# Patient Record
Sex: Female | Born: 1935 | Race: White | Hispanic: No | Smoking: Never smoker
Health system: Southern US, Community
[De-identification: ages and names within clinical notes are randomized; demographics above are authoritative.]

## PROBLEM LIST (undated history)

## (undated) DIAGNOSIS — C9000 Multiple myeloma not having achieved remission: Secondary | ICD-10-CM

## (undated) DIAGNOSIS — R630 Anorexia: Secondary | ICD-10-CM

## (undated) DIAGNOSIS — D72829 Elevated white blood cell count, unspecified: Secondary | ICD-10-CM

## (undated) DIAGNOSIS — R5383 Other fatigue: Secondary | ICD-10-CM

## (undated) DIAGNOSIS — K449 Diaphragmatic hernia without obstruction or gangrene: Secondary | ICD-10-CM

## (undated) DIAGNOSIS — H9190 Unspecified hearing loss, unspecified ear: Secondary | ICD-10-CM

## (undated) DIAGNOSIS — F329 Major depressive disorder, single episode, unspecified: Secondary | ICD-10-CM

## (undated) DIAGNOSIS — K219 Gastro-esophageal reflux disease without esophagitis: Secondary | ICD-10-CM

## (undated) DIAGNOSIS — H3530 Unspecified macular degeneration: Secondary | ICD-10-CM

## (undated) DIAGNOSIS — F0390 Unspecified dementia without behavioral disturbance: Secondary | ICD-10-CM

## (undated) DIAGNOSIS — S32010A Wedge compression fracture of first lumbar vertebra, initial encounter for closed fracture: Secondary | ICD-10-CM

## (undated) HISTORY — DX: Unspecified hearing loss, unspecified ear: H91.90

## (undated) HISTORY — DX: Other fatigue: R53.83

## (undated) HISTORY — DX: Anorexia: R63.0

## (undated) HISTORY — DX: Major depressive disorder, single episode, unspecified: F32.9

## (undated) HISTORY — DX: Multiple myeloma not having achieved remission: C90.00

## (undated) HISTORY — DX: Unspecified dementia, unspecified severity, without behavioral disturbance, psychotic disturbance, mood disturbance, and anxiety: F03.90

## (undated) HISTORY — DX: Elevated white blood cell count, unspecified: D72.829

## (undated) HISTORY — PX: NO PAST SURGERIES: SHX2092

## (undated) HISTORY — DX: Wedge compression fracture of first lumbar vertebra, initial encounter for closed fracture: S32.010A

---

## 1998-08-22 ENCOUNTER — Other Ambulatory Visit: Admission: RE | Admit: 1998-08-22 | Discharge: 1998-08-22 | Payer: Self-pay | Admitting: Gynecology

## 1998-09-04 ENCOUNTER — Ambulatory Visit (HOSPITAL_COMMUNITY): Admission: RE | Admit: 1998-09-04 | Discharge: 1998-09-04 | Payer: Self-pay | Admitting: Gastroenterology

## 2002-05-31 ENCOUNTER — Encounter: Payer: Self-pay | Admitting: Gastroenterology

## 2002-05-31 ENCOUNTER — Encounter: Admission: RE | Admit: 2002-05-31 | Discharge: 2002-05-31 | Payer: Self-pay | Admitting: Gastroenterology

## 2002-07-21 ENCOUNTER — Encounter: Admission: RE | Admit: 2002-07-21 | Discharge: 2002-07-21 | Payer: Self-pay | Admitting: Family Medicine

## 2002-07-21 ENCOUNTER — Encounter: Payer: Self-pay | Admitting: Family Medicine

## 2004-09-26 ENCOUNTER — Ambulatory Visit (HOSPITAL_COMMUNITY): Admission: RE | Admit: 2004-09-26 | Discharge: 2004-09-26 | Payer: Self-pay | Admitting: Gastroenterology

## 2011-06-23 DIAGNOSIS — S32010A Wedge compression fracture of first lumbar vertebra, initial encounter for closed fracture: Secondary | ICD-10-CM

## 2011-06-23 HISTORY — DX: Wedge compression fracture of first lumbar vertebra, initial encounter for closed fracture: S32.010A

## 2011-07-11 ENCOUNTER — Emergency Department (HOSPITAL_COMMUNITY): Payer: Medicare Other

## 2011-07-11 ENCOUNTER — Encounter (HOSPITAL_COMMUNITY): Payer: Self-pay | Admitting: *Deleted

## 2011-07-11 ENCOUNTER — Inpatient Hospital Stay (HOSPITAL_COMMUNITY)
Admission: EM | Admit: 2011-07-11 | Discharge: 2011-07-14 | DRG: 543 | Disposition: A | Discharge status: Skilled Nursing Facility | Payer: Medicare Other | Attending: Family Medicine | Admitting: Family Medicine

## 2011-07-11 ENCOUNTER — Other Ambulatory Visit: Payer: Self-pay

## 2011-07-11 DIAGNOSIS — R5381 Other malaise: Secondary | ICD-10-CM | POA: Diagnosis present

## 2011-07-11 DIAGNOSIS — E871 Hypo-osmolality and hyponatremia: Secondary | ICD-10-CM | POA: Diagnosis present

## 2011-07-11 DIAGNOSIS — E46 Unspecified protein-calorie malnutrition: Secondary | ICD-10-CM | POA: Diagnosis present

## 2011-07-11 DIAGNOSIS — M8448XA Pathological fracture, other site, initial encounter for fracture: Principal | ICD-10-CM | POA: Diagnosis present

## 2011-07-11 DIAGNOSIS — M810 Age-related osteoporosis without current pathological fracture: Secondary | ICD-10-CM | POA: Diagnosis present

## 2011-07-11 DIAGNOSIS — S32000A Wedge compression fracture of unspecified lumbar vertebra, initial encounter for closed fracture: Secondary | ICD-10-CM | POA: Diagnosis present

## 2011-07-11 DIAGNOSIS — H409 Unspecified glaucoma: Secondary | ICD-10-CM | POA: Diagnosis present

## 2011-07-11 DIAGNOSIS — Z23 Encounter for immunization: Secondary | ICD-10-CM

## 2011-07-11 DIAGNOSIS — R4189 Other symptoms and signs involving cognitive functions and awareness: Secondary | ICD-10-CM | POA: Diagnosis present

## 2011-07-11 DIAGNOSIS — R627 Adult failure to thrive: Secondary | ICD-10-CM | POA: Diagnosis present

## 2011-07-11 DIAGNOSIS — F0390 Unspecified dementia without behavioral disturbance: Secondary | ICD-10-CM | POA: Diagnosis present

## 2011-07-11 DIAGNOSIS — D72829 Elevated white blood cell count, unspecified: Secondary | ICD-10-CM | POA: Diagnosis present

## 2011-07-11 DIAGNOSIS — M5126 Other intervertebral disc displacement, lumbar region: Secondary | ICD-10-CM | POA: Diagnosis present

## 2011-07-11 DIAGNOSIS — Z79899 Other long term (current) drug therapy: Secondary | ICD-10-CM

## 2011-07-11 HISTORY — DX: Diaphragmatic hernia without obstruction or gangrene: K44.9

## 2011-07-11 LAB — URINALYSIS, ROUTINE W REFLEX MICROSCOPIC
Glucose, UA: NEGATIVE mg/dL
Hgb urine dipstick: NEGATIVE
Specific Gravity, Urine: 1.027 (ref 1.005–1.030)
pH: 5 (ref 5.0–8.0)

## 2011-07-11 LAB — COMPREHENSIVE METABOLIC PANEL
ALT: 9 U/L (ref 0–35)
AST: 16 U/L (ref 0–37)
CO2: 27 mEq/L (ref 19–32)
Chloride: 91 mEq/L — ABNORMAL LOW (ref 96–112)
GFR calc non Af Amer: 48 mL/min — ABNORMAL LOW (ref >90)
Glucose, Bld: 107 mg/dL — ABNORMAL HIGH (ref 70–99)
Sodium: 126 mEq/L — ABNORMAL LOW (ref 135–145)
Total Bilirubin: 0.3 mg/dL (ref 0.3–1.2)

## 2011-07-11 LAB — CBC
Hemoglobin: 11.9 g/dL — ABNORMAL LOW (ref 12.0–15.0)
MCV: 91.8 fL (ref 78.0–100.0)
Platelets: 365 10*3/uL (ref 150–400)
RBC: 3.9 MIL/uL (ref 3.87–5.11)
WBC: 12.1 10*3/uL — ABNORMAL HIGH (ref 4.0–10.5)

## 2011-07-11 LAB — URINE MICROSCOPIC-ADD ON

## 2011-07-11 MED ORDER — MORPHINE SULFATE 2 MG/ML IJ SOLN
0.5000 mg | INTRAMUSCULAR | Status: DC | PRN
Start: 2011-07-11 — End: 2011-07-14
  Administered 2011-07-12 (×2): 0.5 mg via INTRAVENOUS
  Filled 2011-07-11 (×2): qty 1

## 2011-07-11 MED ORDER — SODIUM CHLORIDE 0.9 % IV SOLN
INTRAVENOUS | Status: DC
  Administered 2011-07-12 – 2011-07-13 (×3): via INTRAVENOUS
  Administered 2011-07-13: 50 mL/h via INTRAVENOUS

## 2011-07-11 MED ORDER — HEPARIN SODIUM (PORCINE) 5000 UNIT/ML IJ SOLN
5000.0000 [IU] | Freq: Three times a day (TID) | INTRAMUSCULAR | Status: DC
  Administered 2011-07-12 – 2011-07-14 (×9): 5000 [IU] via SUBCUTANEOUS
  Filled 2011-07-11 (×11): qty 1

## 2011-07-11 MED ORDER — SODIUM CHLORIDE 0.9 % IV BOLUS (SEPSIS)
1000.0000 mL | Freq: Once | INTRAVENOUS | Status: AC
  Administered 2011-07-11: 1000 mL via INTRAVENOUS

## 2011-07-11 MED ORDER — ENSURE CLINICAL ST REVIGOR PO LIQD
237.0000 mL | Freq: Three times a day (TID) | ORAL | Status: DC
  Administered 2011-07-12 (×3): 237 mL via ORAL

## 2011-07-11 MED ORDER — ACETAMINOPHEN 325 MG PO TABS
650.0000 mg | ORAL_TABLET | Freq: Four times a day (QID) | ORAL | Status: DC | PRN
  Administered 2011-07-12: 650 mg via ORAL
  Filled 2011-07-11: qty 2

## 2011-07-11 MED ORDER — DOCUSATE SODIUM 100 MG PO CAPS
100.0000 mg | ORAL_CAPSULE | Freq: Two times a day (BID) | ORAL | Status: DC
  Administered 2011-07-12 – 2011-07-14 (×6): 100 mg via ORAL
  Filled 2011-07-11 (×7): qty 1

## 2011-07-11 MED ORDER — ACETAMINOPHEN 650 MG RE SUPP: 650.0000 mg | Freq: Four times a day (QID) | RECTAL | Status: DC | PRN

## 2011-07-11 MED ORDER — HEPARIN SODIUM (PORCINE) 5000 UNIT/ML IJ SOLN: 5000.0000 [IU] | Freq: Three times a day (TID) | INTRAMUSCULAR | Status: DC

## 2011-07-11 NOTE — ED Notes (Signed)
The patient used a  Engineer, manufacturing systems.

## 2011-07-11 NOTE — ED Provider Notes (Signed)
  I performed a history and physical examination of Jamie Bass and discussed her management with Dr. Meredith Pel.  I agree with the history, physical, assessment, and plan of care, with the following exceptions: None  Generalized weakness and low back pain. Overall she states she feels poorly. No paresthesias. No loss of sensation. No altered mental status. She denies headache. Denies chest pain, shortness of breath.  Lumbosacral tenderness on palpation. Nonfocal neurologic examination. Regular rate and rhythm. Lungs clear to auscultation  I was present for the following procedures: None Time Spent in Critical Care of the patient: None  Labs, imaging, ivf, sx control.  After review of the labs will require admission for hyponatremia  Tildon Husky, MD 07/11/11 2345

## 2011-07-11 NOTE — ED Notes (Signed)
I gave the patient a warm blanket. 

## 2011-07-11 NOTE — ED Notes (Signed)
Meal tray provided per pt request.  

## 2011-07-11 NOTE — ED Notes (Signed)
Family at bedside. 

## 2011-07-11 NOTE — ED Notes (Signed)
Son requesting to be called by admission MD. [phone] or [phone]. Requesting they be called tonight or tomorrow afternoon.

## 2011-07-11 NOTE — ED Provider Notes (Signed)
History     CSN: [id number]  Arrival date & time 07/11/11  1550   First MD Initiated Contact with Patient 07/11/11 1724      Chief Complaint  Patient presents with  . Weakness    (Consider location/radiation/quality/duration/timing/severity/associated sxs/prior treatment) HPI Comments: 78yo CF with no significant medical comorbidity who presents to the ED with her family primarily due to generalized weakness and low back pain. Onset about 3-4 days ago. Has had some mild cognitive impairment over the last several months, but still lives at home independently and cares for her oldest son who is impaired from bipolar and schizophrenia.   Patient is a 78 y.o. female presenting with weakness. The history is provided by the patient.  Weakness The primary symptoms include memory loss (mild over last several months). Primary symptoms do not include headaches, syncope, loss of consciousness, altered mental status, seizures, dizziness, visual change, paresthesias, focal weakness, loss of sensation, speech change, fever, nausea or vomiting. Episode onset: several months ago. The symptoms are unchanged. The neurological symptoms are diffuse. Context: no recent trauma.  Additional symptoms include weakness (generalized) and lower back pain (mild ). Additional symptoms do not include neck stiffness, leg pain, loss of balance, photophobia, taste disturbance, hearing loss, vertigo, anxiety or irritability. Medical issues do not include seizures, cerebral vascular accident, diabetes or hypertension.    History reviewed. No pertinent past medical history.  History reviewed. No pertinent past surgical history.  History reviewed. No pertinent family history.  History  Substance Use Topics  . Smoking status: Never Smoker   . Smokeless tobacco: Not on file  . Alcohol Use: No    OB History    Grav Para Term Preterm Abortions TAB SAB Ect Mult Living                  Review of Systems    Constitutional: Positive for fatigue. Negative for fever, chills, activity change, appetite change and irritability.  HENT: Negative for hearing loss, congestion, sore throat, rhinorrhea, neck pain and neck stiffness.   Eyes: Negative for photophobia, redness and visual disturbance.  Respiratory: Negative for cough, shortness of breath and wheezing.   Cardiovascular: Negative for chest pain, palpitations, leg swelling and syncope.  Gastrointestinal: Negative for nausea, vomiting, abdominal pain, diarrhea, constipation and blood in stool.  Genitourinary: Negative for dysuria, urgency, hematuria and flank pain.  Musculoskeletal: Positive for back pain (low back).  Skin: Negative for rash and wound.  Neurological: Positive for weakness (generalized). Negative for dizziness, vertigo, speech change, focal weakness, seizures, loss of consciousness, facial asymmetry, speech difficulty, light-headedness, numbness, headaches, paresthesias and loss of balance.  Psychiatric/Behavioral: Positive for memory loss (mild over last several months). Negative for confusion and altered mental status.  All other systems reviewed and are negative.    Allergies  Review of patient's allergies indicates no known allergies.  Home Medications   Current Outpatient Rx  Name Route Sig Dispense Refill  . ACETAMINOPHEN-ASPIRIN BUFFERED 250-250 MG PO TABS Oral Take 1 tablet by mouth every 4 (four) hours as needed. For back pain    . DOXYCYCLINE HYCLATE 100 MG PO TABS Oral Take 100 mg by mouth every 12 (twelve) hours. For 10 days; Start date 07/03/11      BP 109/43  Pulse 80  Temp(Src) 97.4 F (36.3 C) (Oral)  Resp 18  SpO2 96%  Physical Exam  Nursing note and vitals reviewed. Constitutional: She is oriented to person, place, and time. She appears well-developed and well-nourished.  Non-toxic appearance. No distress.  HENT:  Head: Normocephalic and atraumatic.  Mouth/Throat: Oropharynx is clear and moist.   Eyes: Conjunctivae and EOM are normal. Pupils are equal, round, and reactive to light. No scleral icterus.  Neck: Normal range of motion. Neck supple. No JVD present.  Cardiovascular: Normal rate, regular rhythm, normal heart sounds and intact distal pulses.   No murmur heard. Pulmonary/Chest: Effort normal and breath sounds normal. No respiratory distress. She has no wheezes. She has no rales.  Abdominal: Soft. Bowel sounds are normal. She exhibits no distension and no mass. There is no tenderness. There is no rebound, no guarding and no CVA tenderness.  Musculoskeletal: Normal range of motion.       Cervical back: Normal.       Thoracic back: Normal.       Lumbar back: She exhibits tenderness (soft tissues of lumbar region. ). She exhibits no bony tenderness, no swelling, no edema, no deformity and no laceration.  Neurological: She is alert and oriented to person, place, and time. She has normal strength. No cranial nerve deficit. GCS eye subscore is 4. GCS verbal subscore is 5. GCS motor subscore is 6.  Skin: Skin is warm and dry. No rash noted. She is not diaphoretic.  Psychiatric: She has a normal mood and affect.    ED Course  Procedures (including critical care time)  Labs Reviewed  CBC - Abnormal; Notable for the following:    WBC 12.1 (*)    Hemoglobin 11.9 (*)    HCT 35.8 (*)    All other components within normal limits  COMPREHENSIVE METABOLIC PANEL - Abnormal; Notable for the following:    Sodium 126 (*)    Chloride 91 (*)    Glucose, Bld 107 (*)    BUN 26 (*)    Albumin 2.6 (*)    GFR calc non Af Amer 48 (*)    GFR calc Af Amer 56 (*)    All other components within normal limits  URINALYSIS, ROUTINE W REFLEX MICROSCOPIC - Abnormal; Notable for the following:    Color, Urine AMBER (*) BIOCHEMICALS MAY BE AFFECTED BY COLOR   APPearance CLOUDY (*)    Bilirubin Urine SMALL (*)    Ketones, ur 15 (*)    Protein, ur 30 (*)    Leukocytes, UA SMALL (*)    All other  components within normal limits  URINE MICROSCOPIC-ADD ON - Abnormal; Notable for the following:    Squamous Epithelial / LPF FEW (*)    All other components within normal limits  LIPASE, BLOOD  LACTIC ACID, PLASMA  TROPONIN I  TSH   Dg Chest 2 View  07/11/2011  *RADIOLOGY REPORT*  Clinical Data: Low back pain.  No known trauma.  CHEST - 2 VIEW  Comparison: None.  Findings: Normal heart size with calcified tortuous aorta.  Mild scarring or subsegmental atelectasis, left base laterally but no infiltrates or failure.  No effusion or pneumothorax.  No visible thoracic spine compression fractures. Please see lumbar spine dictation for additional findings.  IMPRESSION: Chronic changes.  No active infiltrates.  No visible thoracic compression fracture. L1 compression fracture suspected.  Original Report Authenticated By: Elsie Stain, M.D.   Dg Lumbar Spine Complete  07/11/2011  *RADIOLOGY REPORT*  Clinical Data: Low back pain  LUMBAR SPINE - COMPLETE 4+ VIEW  Comparison: None.  Findings: Five lumbar type vertebral bodies are present.  The bones appear demineralized.  There is a wedge deformity of L1 with  loss of approximately 50% vertebral body height, age indeterminate. Disc space narrowing L2-L3 and L1-L2.  Mild 3 mm facet mediated slip L4 on L5 is observed.  Vascular calcification is seen.  IMPRESSION: Age indeterminate L1 compression fracture.  3 mm facet mediated slip L4-L5.  Per CMS PQRS reporting requirements (PQRS Measure 24): Given the patient's age of greater than 50 and the fracture site (hip, distal radius, or spine), the patient should be tested for osteoporosis using DXA, and the appropriate treatment considered based on the DXA results.  Original Report Authenticated By: Elsie Stain, M.D.     1. Hyponatremia   2. Cognitive decline   3. Lumbar compression fracture       MDM  78yo CF with no significant medical comorbidity who presents to the ED with her family primarily due to  generalized weakness and low back pain. Onset about 3-4 days ago. Has had some mild cognitive impairment over the last several months, but still lives at home independently and cares for her oldest son who is impaired from bipolar and schizophrenia.   Pt appears well on exam. Just states that she has mild weakness and fatigue but still wakes up early every morning and does exercises. Pt with muscle tenderness in lower back. No abdominal pain or palpable masses. She does not recall any trauma and no evidence of trauma on exam. Will send basic labs and eval for anemia or UTI. Considered AAA but no abd pain and palpable low back tenderness.   Pt hyponatremic with L1 compression fx.   Family med consulted for admission.         Verne Carrow, MD 07/11/11 [phone]

## 2011-07-11 NOTE — ED Notes (Signed)
The pt is c/o  Some lower back pain and weakness today.  Jittery and anxious apperaing

## 2011-07-12 ENCOUNTER — Inpatient Hospital Stay (HOSPITAL_COMMUNITY): Payer: Medicare Other

## 2011-07-12 ENCOUNTER — Encounter (HOSPITAL_COMMUNITY): Payer: Self-pay | Admitting: *Deleted

## 2011-07-12 DIAGNOSIS — IMO0002 Reserved for concepts with insufficient information to code with codable children: Secondary | ICD-10-CM

## 2011-07-12 DIAGNOSIS — E871 Hypo-osmolality and hyponatremia: Secondary | ICD-10-CM

## 2011-07-12 DIAGNOSIS — F0390 Unspecified dementia without behavioral disturbance: Secondary | ICD-10-CM

## 2011-07-12 LAB — BASIC METABOLIC PANEL
Chloride: 98 mEq/L (ref 96–112)
Creatinine, Ser: 0.87 mg/dL (ref 0.50–1.10)
GFR calc Af Amer: 64 mL/min — ABNORMAL LOW (ref >90)
Potassium: 3.8 mEq/L (ref 3.5–5.1)
Sodium: 130 mEq/L — ABNORMAL LOW (ref 135–145)

## 2011-07-12 LAB — VITAMIN B12: Vitamin B-12: 475 pg/mL (ref 211–911)

## 2011-07-12 LAB — CBC
HCT: 34.4 % — ABNORMAL LOW (ref 36.0–46.0)
RDW: 13.4 % (ref 11.5–15.5)
WBC: 7.9 10*3/uL (ref 4.0–10.5)

## 2011-07-12 MED ORDER — ACETAMINOPHEN 650 MG RE SUPP
650.0000 mg | Freq: Four times a day (QID) | RECTAL | Status: DC
  Filled 2011-07-12 (×8): qty 1

## 2011-07-12 MED ORDER — CALCIUM CARBONATE-VITAMIN D 500-200 MG-UNIT PO TABS
1.0000 | ORAL_TABLET | Freq: Three times a day (TID) | ORAL | Status: DC
  Administered 2011-07-12 – 2011-07-14 (×8): 1 via ORAL
  Filled 2011-07-12 (×9): qty 1

## 2011-07-12 MED ORDER — INFLUENZA VIRUS VACC SPLIT PF IM SUSP
0.5000 mL | INTRAMUSCULAR | Status: AC
  Filled 2011-07-12: qty 0.5

## 2011-07-12 MED ORDER — CALCITONIN (SALMON) 200 UNIT/ACT NA SOLN
1.0000 | Freq: Every day | NASAL | Status: DC
  Administered 2011-07-12 – 2011-07-14 (×2): 1 via NASAL
  Filled 2011-07-12: qty 3.7

## 2011-07-12 MED ORDER — ACETAMINOPHEN 325 MG PO TABS
650.0000 mg | ORAL_TABLET | Freq: Four times a day (QID) | ORAL | Status: DC
  Administered 2011-07-12 – 2011-07-14 (×10): 650 mg via ORAL
  Filled 2011-07-12 (×5): qty 2
  Filled 2011-07-12: qty 1
  Filled 2011-07-12 (×5): qty 2

## 2011-07-12 MED ORDER — BIOTENE DRY MOUTH MT LIQD
15.0000 mL | Freq: Two times a day (BID) | OROMUCOSAL | Status: DC
  Administered 2011-07-12 – 2011-07-14 (×4): 15 mL via OROMUCOSAL

## 2011-07-12 NOTE — Progress Notes (Signed)
PGY-1 Daily Progress Note Family Medicine Teaching Service Aldine Contes. Marti Sleigh, MD Service Pager: [phone]   Subjective: Patient reports 0/10 pain at rest this morning after receiving Tylenol last night. Minimal pain with sitting up. Patient states she feels much better. She is sad she isn't getting to go to church today.   Objective:  Temp:  [97.4 F (36.3 C)-97.8 F (36.6 C)] 97.6 F (36.4 C) (01/20 0500) Pulse Rate:  [73-85] 73  (01/20 0500) Resp:  [16-18] 18  (01/20 0500) BP: (109-153)/(43-71) 153/71 mmHg (01/20 0500) SpO2:  [96 %-99 %] 99 % (01/20 0500) Weight:  [123 lb 14.4 oz (56.2 kg)] 123 lb 14.4 oz (56.2 kg) (01/19 2309)  Intake/Output Summary (Last 24 hours) at 07/12/11 0840 Last data filed at 07/12/11 0700  Gross per 24 hour  Intake  962.5 ml  Output    400 ml  Net  562.5 ml    Gen:  NAD, thin elderly female HEENT: moist mucous membranes when previously dry. Multiple cavities CV: Regular rate and rhythm, no murmurs rubs or gallops PULM: minimal crackles in RLL. No wheezes. Breathing not labored ABD: soft/nontender/nondistended/normal bowel sounds EXT: Trace edema Neuro: Alert to person and place. Timing-only knows the month.  Gait-not unsteady, patient also gets in and out of bed without difficulty  Labs and imaging:   CBC  Lab 07/12/11 0500 07/11/11 1801  WBC 7.9 12.1*  HGB 11.3* 11.9*  HCT 34.4* 35.8*  PLT 342 365   BMET  Lab 07/12/11 0500 07/11/11 1801  NA 130* 126*  K 3.8 3.9  CL 98 91*  CO2 26 27  BUN 20 26*  CREATININE 0.87 0.97  LABGLOM -- --  GLUCOSE 100* --  CALCIUM 8.7 8.9   LIPASE, BLOOD     Status: Normal   Collection Time   07/11/11  6:01 PM      Component Value Range   Lipase 44  11 - 59 (U/L)  TSH     Status: Normal   Collection Time   07/11/11  6:01 PM      Component Value Range   TSH 4.108  0.350 - 4.500 (uIU/mL)  LACTIC ACID, PLASMA     Status: Normal   Collection Time   07/11/11  6:02 PM      Component Value Range     Lactic Acid, Venous 1.3  0.5 - 2.2 (mmol/L)  TROPONIN I     Status: Normal   Collection Time   07/11/11  6:06 PM      Component Value Range   Troponin I <0.30  <0.30 (ng/mL)  URINALYSIS, ROUTINE W REFLEX MICROSCOPIC     Status: Abnormal   Collection Time   07/11/11  6:20 PM      Component Value Range   Color, Urine AMBER (*) YELLOW    APPearance CLOUDY (*) CLEAR    Specific Gravity, Urine 1.027  1.005 - 1.030    pH 5.0  5.0 - 8.0    Glucose, UA NEGATIVE  NEGATIVE (mg/dL)   Hgb urine dipstick NEGATIVE  NEGATIVE    Bilirubin Urine SMALL (*) NEGATIVE    Ketones, ur 15 (*) NEGATIVE (mg/dL)   Protein, ur 30 (*) NEGATIVE (mg/dL)   Urobilinogen, UA 1.0  0.0 - 1.0 (mg/dL)   Nitrite NEGATIVE  NEGATIVE    Leukocytes, UA SMALL (*) NEGATIVE   URINE MICROSCOPIC-ADD ON     Status: Abnormal   Collection Time   07/11/11  6:20 PM  Component Value Range   Squamous Epithelial / LPF FEW (*) RARE    WBC, UA 3-6  <3 (WBC/hpf)   Dg Chest 2 View  07/11/2011  *RADIOLOGY REPORT*  Clinical Data: Low back pain.  No known trauma.  CHEST - 2 VIEW  Comparison: None.  Findings: Normal heart size with calcified tortuous aorta.  Mild scarring or subsegmental atelectasis, left base laterally but no infiltrates or failure.  No effusion or pneumothorax.  No visible thoracic spine compression fractures. Please see lumbar spine dictation for additional findings.  IMPRESSION: Chronic changes.  No active infiltrates.  No visible thoracic compression fracture. L1 compression fracture suspected.  Original Report Authenticated By: Elsie Stain, M.D.   Dg Lumbar Spine Complete  07/11/2011  *RADIOLOGY REPORT*  Clinical Data: Low back pain  LUMBAR SPINE - COMPLETE 4+ VIEW  Comparison: None.  Findings: Five lumbar type vertebral bodies are present.  The bones appear demineralized.  There is a wedge deformity of L1 with loss of approximately 50% vertebral body height, age indeterminate. Disc space narrowing L2-L3 and L1-L2.   Mild 3 mm facet mediated slip L4 on L5 is observed.  Vascular calcification is seen.  IMPRESSION: Age indeterminate L1 compression fracture.  3 mm facet mediated slip L4-L5.  Per CMS PQRS reporting requirements (PQRS Measure 24): Given the patient's age of greater than 50 and the fracture site (hip, distal radius, or spine), the patient should be tested for osteoporosis using DXA, and the appropriate treatment considered based on the DXA results.  Original Report Authenticated By: Elsie Stain, M.D.     Assessment  78 y.o. year old female without an established PCP with generalized weakness, progressive decline in memory and function over last 6 months,  and low back pain found to be dehydrated and hyponatremic as well as to have an L1 compression fracture and L4-L5 facet mediated slipped disc.   1. Weakness/deconditioning-likely multifactorial including aging, hyponatremia, dehydration, deconditioning, recent URI, back pain preventing movement. Patient reports doing 2 pushups within the last few days. Unsure how much of decline is weakness vs. Memory. Patient with good movement and strength this morning.  *PT/OT to evaluate patient-if no SNF recommendation, possibly could d/c home after workup per #3.  *consult to SW for possible placment given primary caregiver with limitations. Family seems primarily interested in bringing patient home but will f/u recs PT/OT.  2. Hyponatremia- Na 126 in ED. believed to be hypovolemic hyponatremia. Patient also with tea and toast diet likely contributing.  S/p 1L bolus in ED. Hydrated with NS at 75cc per hour overnight with Sodium improved to 130 this AM. Will continue NS and recheck AM BMET if still here.   3. Low back pain-L1 compression fracture and L4-L5 facet mediated slipped disc  *tylenol prn for pain has controlled pain. Will switch to scheduled tylenol.  *also starting calicitonin  *also has prn morphine ordered at 0.5mg  q4 hours. Patient has not  required to this point.  *bisphosphonate at discharge *calcium and vitamin D supplementation. Pending Vitamin D level.   4. Progressive decline in mental status with concern for dementia-MMSE 21-no acute change in mental status per family. Family with concerns for dementia and have PCP appt on Friday. Will start dementia workup *CT Head pending *f/u TSH, B1, folic acid, RPR  4. Leukocytosis-now resolved with hydration. UA with small leukocytes but asymptomatic. CXR without acute process. No signs of skin breakdown. Will monitor fever curve and repeat CBC in AM. Will not continue  doxycycline as prescribed by urgent care for URI symptoms   5. Dehydration/malnutrition-started on ensure shakes and consulted nutrition. Prealbumin pending. Fluid repletion per #1.   FEN/GI-NS at 75/hr. REgular diet with ensure shakes.  PPx-heparin sq  Dispo-pending further evaluation  Tana Conch, MD PGY1, Family Medicine Teaching Service [phone]

## 2011-07-12 NOTE — Progress Notes (Signed)
Physical Therapy Evaluation Patient Details Name: Jamie Bass MRN: [id number] DOB: Jun 01, 1915 Today's Date: 07/12/2011  Problem List: There is no problem list on file for this patient.   Past Medical History:  Past Medical History  Diagnosis Date  . Hiatal hernia   . Glaucoma    Past Surgical History:  Past Surgical History  Procedure Date  . No past surgeries     PT Assessment/Plan/Recommendation PT Assessment Clinical Impression Statement: 78 yo female admitted with lumbar compression fx, hyponatremia, and cognitive decline presents to PT with decr functional mobility and impairmetns listed below; will benefit from acute PT to maximize independence and safety with mobility to enable safe dc home with son PT Recommendation/Assessment: Patient will need skilled PT in the acute care venue PT Problem List: Decreased range of motion;Decreased activity tolerance;Decreased balance;Decreased mobility;Decreased cognition;Decreased knowledge of use of DME;Decreased safety awareness;Decreased knowledge of precautions;Pain PT Therapy Diagnosis : Difficulty walking;Acute pain PT Plan PT Frequency: Min 5X/week PT Treatment/Interventions: DME instruction;Gait training;Stair training;Functional mobility training;Therapeutic activities;Therapeutic exercise;Balance training;Patient/family education;Cognitive remediation PT Recommendation Recommendations for Other Services: OT consult Follow Up Recommendations: Home health PT;Supervision/Assistance - 24 hour Equipment Recommended: Rolling walker with 5" wheels;3 in 1 bedside comode PT Goals  Acute Rehab PT Goals PT Goal Formulation: With patient Time For Goal Achievement: 2 weeks Pt will go Supine/Side to Sit: with modified independence (log roll technique for comfort) PT Goal: Supine/Side to Sit - Progress: Goal set today Pt will go Sit to Supine/Side: with modified independence PT Goal: Sit to Supine/Side - Progress: Goal set today Pt  will go Sit to Stand: with supervision PT Goal: Sit to Stand - Progress: Goal set today Pt will go Stand to Sit: with supervision PT Goal: Stand to Sit - Progress: Goal set today Pt will Ambulate: >150 feet;with supervision;with rolling walker PT Goal: Ambulate - Progress: Goal set today Pt will Go Up / Down Stairs: 3-5 stairs;with min assist;with rail(s) PT Goal: Up/Down Stairs - Progress: Goal set today  PT Evaluation Precautions/Restrictions  Precautions Precautions: Fall Prior Functioning  Home Living Lives With: Sheran Spine Help From: Family Type of Home: House Home Layout: One level Home Access: Stairs to enter Entrance Stairs-Rails: Right;Left (not exac) Entrance Stairs-Number of Steps: 4 Prior Function Level of Independence: Independent with basic ADLs (pt reports she performs pushups daily) Comments: Not sure of relaibility of pt re: PLOF Cognition Cognition Arousal/Alertness: Awake/alert Overall Cognitive Status: Impaired Safety/Judgement: Decreased awareness of safety precautions;Decreased safety judgement for tasks assessed Decreased Safety/Judgement: Decreased awareness of need for assistance Awareness of Deficits: Decreased awareness of deficits Cognition - Other Comments: general decline in cognitive status Sensation/Coordination Sensation Light Touch: Appears Intact Coordination Gross Motor Movements are Fluid and Coordinated: Yes Fine Motor Movements are Fluid and Coordinated: Not tested Extremity Assessment RUE Assessment RUE Assessment: Within Functional Limits LUE Assessment LUE Assessment: Within Functional Limits RLE Assessment RLE Assessment: Exceptions to [facility] RLE Strength RLE Overall Strength Comments: grossly limited by back pain LLE Assessment LLE Assessment: Exceptions to [facility] LLE Strength LLE Overall Strength Comments: grossly limited by back pain Mobility (including Balance) Bed Mobility Bed Mobility: Yes Supine to Sit: 4: Min  assist Supine to Sit Details (indicate cue type and reason): cues for safe transfer Transfers Transfers: Yes Sit to Stand: 4: Min assist Sit to Stand Details (indicate cue type and reason): safety cues Stand to Sit: 4: Min assist;To chair/3-in-1;With upper extremity assist Stand to Sit Details: cues to control descent Ambulation/Gait Ambulation/Gait: Yes Ambulation/Gait Assistance:  4: Min assist Ambulation/Gait Assistance Details (indicate cue type and reason): noted pt initially reaching out for UE support, indicative of fall risk; offered RW, which pt used overall well with cues for posture and RW proximity, cues also to push down into RW to take pressure off of painful low back Ambulation Distance (Feet): 80 Feet Assistive device: Rolling walker Gait Pattern: Antalgic       End of Session PT - End of Session Activity Tolerance: Patient tolerated treatment well;Patient limited by pain Patient left: in chair;with call bell in reach General Behavior During Session: [facility] for tasks performed Cognition: Impaired, at baseline  Van Clines [facility] [place], [place] [phone]  07/12/2011, 5:31 PM

## 2011-07-12 NOTE — H&P (Signed)
Family Medicine Teaching [facility] Admission History and Physical  Patient name: Jamie Bass Medical record number: [id number] Date of birth: Apr 26, 1915 Age: 78 y.o. Gender: female  Primary Care Provider: Kaleen Mask, MD, MD reportedly of [facility]  Chief Complaint: generalized weakness and low back pain History of Present Illness: Jamie Bass is a 78 y.o. year old female without an established PCP, history or occasional UTIs through urgent care presenting with generalized weakness and low back pain.   Over the past 2-3 days, patient has reported increased generalized weakness in addition to low back pain. She has spent several hours a day moaning due to back pain.  Pain located lateral to spine and rated 8/10 with movement. No reported history of falls, fevers, chills, trauma, bowel or bladder incontinence, saddle anesthesia.   She has had decreased PO during these last few days but overall in the last 2 weeks she has been eating less after getting some URI symptoms (treated with doxycycline per family report). For example, today only had 2 pieces of toast and an ensure all day. Family states patient has had a somewhat progressive decline in memory and general activity level over the last 6 months. Previously, she did the majority of cooking, cleaning , and yardwork for her and her son who is bipolar and schizophrenic. This son has been providing more and more help over the last 6 months.   They were planning to see Dr. Jeannetta Nap on Friday of this week to discuss these things as well as low back pain but did not get in due to the snow. They are planning on seeing him next Friday.   In the ED, the patient was found to have an L1 compression fracture, signs of dehydration, as well as to be hyponatremic into the 120s. Family medicine was consulted for admission.     Past Medical History  Diagnosis Date  . Hiatal hernia   . Glaucoma   History of occasional  UTI  History reviewed. No pertinent past surgical history.  History reviewed. No pertinent family history. Social History:  reports that she has never smoked. She does not have any smokeless tobacco history on file. She reports that she does not drink alcohol. Her drug history not on file. Lives with bipolar son Jamie Bass.  Best contact is son Jamie Bass either at [phone] or [phone].   Allergies: No Known Allergies  Medications Prior to Admission  Medication Dose Route Frequency Provider Last Rate Last Dose  . 0.9 %  sodium chloride infusion   Intravenous Continuous Tana Conch, MD      . acetaminophen (TYLENOL) tablet 650 mg  650 mg Oral Q6H PRN Tana Conch, MD       Or  . acetaminophen (TYLENOL) suppository 650 mg  650 mg Rectal Q6H PRN Tana Conch, MD      . docusate sodium (COLACE) capsule 100 mg  100 mg Oral BID Tana Conch, MD      . feeding supplement (ENSURE CLINICAL STRENGTH) liquid 237 mL  237 mL Oral TID WC Tana Conch, MD      . heparin injection 5,000 Units  5,000 Units Subcutaneous Q8H Tana Conch, MD      . morphine 2 MG/ML injection 0.5 mg  0.5 mg Intravenous Q4H PRN Tana Conch, MD      . sodium chloride 0.9 % bolus 1,000 mL  1,000 mL Intravenous Once Verne Carrow, MD   1,000 mL at  07/11/11 1808  . DISCONTD: heparin injection 5,000 Units  5,000 Units Subcutaneous Q8H Ivy de Lawson Radar, MD       No current outpatient prescriptions on file as of 07/12/2011.    Results for orders placed during the hospital encounter of 07/11/11 (from the past 48 hour(s))  CBC     Status: Abnormal   Collection Time   07/11/11  6:01 PM      Component Value Range Comment   WBC 12.1 (*) 4.0 - 10.5 (K/uL)    RBC 3.90  3.87 - 5.11 (MIL/uL)    Hemoglobin 11.9 (*) 12.0 - 15.0 (g/dL)    HCT 16.1 (*) 09.6 - 46.0 (%)    MCV 91.8  78.0 - 100.0 (fL)    MCH 30.5  26.0 - 34.0 (pg)    MCHC 33.2  30.0 - 36.0 (g/dL)    RDW 04.5  40.9 - 81.1 (%)    Platelets 365  150 - 400  (K/uL)   COMPREHENSIVE METABOLIC PANEL     Status: Abnormal   Collection Time   07/11/11  6:01 PM      Component Value Range Comment   Sodium 126 (*) 135 - 145 (mEq/L)    Potassium 3.9  3.5 - 5.1 (mEq/L)    Chloride 91 (*) 96 - 112 (mEq/L)    CO2 27  19 - 32 (mEq/L)    Glucose, Bld 107 (*) 70 - 99 (mg/dL)    BUN 26 (*) 6 - 23 (mg/dL)    Creatinine, Ser 9.14  0.50 - 1.10 (mg/dL)    Calcium 8.9  8.4 - 10.5 (mg/dL)    Total Protein 7.3  6.0 - 8.3 (g/dL)    Albumin 2.6 (*) 3.5 - 5.2 (g/dL)    AST 16  0 - 37 (U/L)    ALT 9  0 - 35 (U/L)    Alkaline Phosphatase 117  39 - 117 (U/L)    Total Bilirubin 0.3  0.3 - 1.2 (mg/dL)    GFR calc non Af Amer 48 (*) >90 (mL/min)    GFR calc Af Amer 56 (*) >90 (mL/min)   LIPASE, BLOOD     Status: Normal   Collection Time   07/11/11  6:01 PM      Component Value Range Comment   Lipase 44  11 - 59 (U/L)   LACTIC ACID, PLASMA     Status: Normal   Collection Time   07/11/11  6:02 PM      Component Value Range Comment   Lactic Acid, Venous 1.3  0.5 - 2.2 (mmol/L)   TROPONIN I     Status: Normal   Collection Time   07/11/11  6:06 PM      Component Value Range Comment   Troponin I <0.30  <0.30 (ng/mL)   URINALYSIS, ROUTINE W REFLEX MICROSCOPIC     Status: Abnormal   Collection Time   07/11/11  6:20 PM      Component Value Range Comment   Color, Urine AMBER (*) YELLOW  BIOCHEMICALS MAY BE AFFECTED BY COLOR   APPearance CLOUDY (*) CLEAR     Specific Gravity, Urine 1.027  1.005 - 1.030     pH 5.0  5.0 - 8.0     Glucose, UA NEGATIVE  NEGATIVE (mg/dL)    Hgb urine dipstick NEGATIVE  NEGATIVE     Bilirubin Urine SMALL (*) NEGATIVE     Ketones, ur 15 (*) NEGATIVE (mg/dL)    Protein, ur 30 (*)  NEGATIVE (mg/dL)    Urobilinogen, UA 1.0  0.0 - 1.0 (mg/dL)    Nitrite NEGATIVE  NEGATIVE     Leukocytes, UA SMALL (*) NEGATIVE    URINE MICROSCOPIC-ADD ON     Status: Abnormal   Collection Time   07/11/11  6:20 PM      Component Value Range Comment   Squamous  Epithelial / LPF FEW (*) RARE     WBC, UA 3-6  <3 (WBC/hpf)    Dg Chest 2 View  07/11/2011  *RADIOLOGY REPORT*  Clinical Data: Low back pain.  No known trauma.  CHEST - 2 VIEW  Comparison: None.  Findings: Normal heart size with calcified tortuous aorta.  Mild scarring or subsegmental atelectasis, left base laterally but no infiltrates or failure.  No effusion or pneumothorax.  No visible thoracic spine compression fractures. Please see lumbar spine dictation for additional findings.  IMPRESSION: Chronic changes.  No active infiltrates.  No visible thoracic compression fracture. L1 compression fracture suspected.  Original Report Authenticated By: Elsie Stain, M.D.   Dg Lumbar Spine Complete  07/11/2011  *RADIOLOGY REPORT*  Clinical Data: Low back pain  LUMBAR SPINE - COMPLETE 4+ VIEW  Comparison: None.  Findings: Five lumbar type vertebral bodies are present.  The bones appear demineralized.  There is a wedge deformity of L1 with loss of approximately 50% vertebral body height, age indeterminate. Disc space narrowing L2-L3 and L1-L2.  Mild 3 mm facet mediated slip L4 on L5 is observed.  Vascular calcification is seen.  IMPRESSION: Age indeterminate L1 compression fracture.  3 mm facet mediated slip L4-L5.  Per CMS PQRS reporting requirements (PQRS Measure 24): Given the patient's age of greater than 50 and the fracture site (hip, distal radius, or spine), the patient should be tested for osteoporosis using DXA, and the appropriate treatment considered based on the DXA results.  Original Report Authenticated By: Elsie Stain, M.D.    ROS. No dysuria or polyuria. negative except as noted in HPI    Blood pressure 136/69, pulse 82, temperature 97.8 F (36.6 C), temperature source Oral, resp. rate 16, height 5\' 4"  (1.626 m), weight 123 lb 14.4 oz (56.2 kg), SpO2 98.00%. Physical Exam  Vitals reviewed. Constitutional: No distress.       Thin elderly female  HENT:  Head: Normocephalic and  atraumatic.  Mouth/Throat: No oropharyngeal exudate (multiple cavities noted. ).       Dry mucus membranes.   Neck: Normal range of motion. Neck supple. No thyromegaly present.  Cardiovascular: Normal rate and regular rhythm.  Exam reveals no gallop and no friction rub.   No murmur heard. Respiratory: Effort normal. No respiratory distress. She has no wheezes. She has rales (crackles/rales in RLL).  GI: Soft. Bowel sounds are normal. She exhibits no distension. There is no tenderness.  Musculoskeletal: She exhibits edema (trace bilaterally).       Arms: Neurological: She is alert.       MMSE performed and =21. Patient alert to person and place but not to time. Unclear of baseline. 5/5 on right side muscle strength. Unable to determine strength in LLE due to pain with movement. 5/5 grip strength RUE.      Assessment/Plan  78 y.o. year old female without an established PCP, history or occasional UTIs through urgent care presenting with generalized weakness and low back pain found to be dehydrated and hyponatremic as well as to have an L1 compression fracture and L4-L5 facet mediated slipped disc.  1. Weakness-likely multifactorial including aging, hyponatremia, dehydration, deconditioning, recent URI, back pain preventing movement *Hyponatremia- Na 126 in ED. believed to be hypovolemic hyponatremia. S/p 1L bolus in ED. Will hydrate with NS at 75cc per hour overnight and reevaluate with AM BMET. *Neuro exam unremarkable at this time. WIll not pursue CT head unless mental status changes or changes in neurological exam.   2. Low back pain-L1 compression fracture and L4-L5 facet mediated slipped disc *will start with tylenol for pain. If not controlled, then would consider calcitonin. Avoiding NSAids as gfr approximately 50.  *also has prn morphine ordered at 0.5mg  q4 hours. Patient has not required to this point.   3. Progressive decline in mental status/deconditioning-patient potentially will  need SNF *PT/OT to evaluate patient *possible placement *MMSE 21. Family reports this seems to be patient's baseline. Will need outpatient evaluation for dementia as score <24 can be indicative of this. Do not suspect delerium as no reported change in mental status per family *consult to SW for possible placment given primary caregiver with limitations. Family seems primarily interested in bringing patient home but will f/u recs PT/OT.   4. Leukocytosis-UA with small leukocytes but asymptomatic. CXR without acute process. No signs of skin breakdown. Will monitor fever curve and repeat CBC in AM. Will not continue doxycycline as prescribed by urgent care for URI symptoms  5. Dehydration/malnutrition-started on ensure shakes and consulted nutrition. Prealbumin pending. Fluid repletion per #1.   FEN/GI-NS at 75/hr. REgular diet with ensure shakes.  PPx-heparin sq Dispo-pending further evaluation  Tana Conch, MD, PGY1 07/12/2011, 12:27 AM  PGY-2 ADDENDUM:  I have seen and examined patient with Dr. Durene Cal and I agree with his assessment and plan.  HPI: Briefly, this is a 78 yo white female with no PCP or significant PMH who presents to ED with worsening short term memory loss, weakness, confusion, and low back pain.  Symptoms have been gradually worsening for last few months, but 2 days ago, patient was moaning and complaining of low back pain.  Additionally, she has not been eating well and was seen at Urgent Care last week for cough and cold symptoms.  Patient was started on Doxycycline.  Patient lives with son who is schizophrenic and she is his caregiver.  She was able to cook, clean, and perform ADLs independently, but this been gradually declining.  ED course: found to have Na 126, X-ray lumbar spine revealed compression fracture L1 and 3 mm facet mediated slip L4-L5.  ROS: Patient endorses low back pain.  She denies SOB, chest pain, abdominal pain.  She denies nausea/vomiting, fever,  chills, night sweats.  PHYSICAL EXAM: GENERAL:  alert, awake, oriented to place and self only HEENT: pupils constricted, but PERLA. EOMI. Oropharynx dry. NECK: supple, NT, no JVD, or LAD CARDS: distant HS, RRR, no murmur appreciated at this time RESP: Clear bilaterally, no wheezes, scattered rhonchi RLL GI: soft, NT, ND, active BS MSK: diminished pedal pulses, no C/C/E NEURO: MMSE 21, CN 2-12 intact, sensation intact, 5/5 strength upper R LE and RUE, 4/5 strength LUE and LLE but likely due to poor effort and back pain  ASSESSMENT/PLAN: 1) Weakness: likely multifactorial etiology, may be secondary to dehydration, low sodium, dementia - Will replete NA and hydrate with NS @ 75 cc/hr - No evidence of head trauma or unilateral weakness at this time, but low threshold for head CT if patient's mental status worsens 2) Back pain: secondary to compression fracture - Tylenol 650 mg PRN and Morphine 0.5  q 4 PRN - Consider Calcitonin 3) Deconditioning: patient likely will need HHRN or SNF placement - PT/OT and SW consults 4) FEN/GI: Regular diet with Ensure supplements, NS @ 75 cc/hr 5) PPX: Heparin [place] TID 6) Disposition: pending clinical improvement and PT recommendations for placemen  Ivy de la Sondra Come, DO [facility] [phone]

## 2011-07-12 NOTE — H&P (Signed)
I have seen and examined this patient. I have discussed with Dr Durene Cal and Dr Tye Savoy.  I agree with their findings and plans as documented in their adm note for today.  Acute Issues 1. Lumbar (L1) vertebral compression fracture - Tender to percussion of L1 spinous process, suspect fracture is acute to subacute. -  Start Bisphosphanate at discharge - Start Calcium and Vitamin D. - Check Vitamin D (calcidiol) serum level to look for Vit D insufficiency or deficiency. - Schedule APAP.  If needing frequent Morphine IV for breakthrough pain, switch to oxycodone 2.5 mg PO every four hours as needed. - PT/OT evaluation for iADL and ADL abilities and treatment. - Case Manager consult to assess home health needs and resources  2. Hypoalbuminemia - Suspect Protein-caloric malnutrition - Check Prealbumin - Nutrition Consult  3. Failure to Thrive, adult - 21/30 MMSE. - Will check for reversible causes of dementia: Head CT, TSH, Vit B12/folate, RPR.

## 2011-07-12 NOTE — Progress Notes (Signed)
I have seen and examined this patient. I have discussed with Hunter.  I agree with their findings and plans as documented in their progress note for today.  

## 2011-07-13 LAB — BASIC METABOLIC PANEL
GFR calc Af Amer: 84 mL/min — ABNORMAL LOW (ref >90)
GFR calc non Af Amer: 72 mL/min — ABNORMAL LOW (ref >90)
Glucose, Bld: 117 mg/dL — ABNORMAL HIGH (ref 70–99)
Potassium: 4 mEq/L (ref 3.5–5.1)
Sodium: 133 mEq/L — ABNORMAL LOW (ref 135–145)

## 2011-07-13 LAB — FOLATE RBC: RBC Folate: 1209 ng/mL — ABNORMAL HIGH

## 2011-07-13 MED ORDER — ENSURE CLINICAL ST REVIGOR PO LIQD
237.0000 mL | Freq: Three times a day (TID) | ORAL | Status: DC
  Administered 2011-07-13 – 2011-07-14 (×3): 237 mL via ORAL

## 2011-07-13 NOTE — Progress Notes (Signed)
Occupational Therapy Evaluation Patient Details Name: Jamie Bass MRN: [id number] DOB: May 28, 1915 Today's Date: 07/13/2011  Problem List: There is no problem list on file for this patient.   Past Medical History:  Past Medical History  Diagnosis Date  . Hiatal hernia   . Glaucoma    Past Surgical History:  Past Surgical History  Procedure Date  . No past surgeries     OT Assessment/Plan/Recommendation OT Assessment Clinical Impression Statement: Pt is a 78 year old woman admitted with lumbar compression fx, hyponatremia, and cognitive decline.  Pt was modified independent in showering, independent all other ADL.  She and her son worked together on cooking and housekeeping.  Pt is now at min assist with mobility and ADL.  There is concern that her son is unable to adequately care for his mother at home.  Pt has decreased safety awareness and awareness of deficits.  Recommend pt d/c to SNF for short term rehab.  OT will follow acutely. OT Recommendation/Assessment: Patient will need skilled OT in the acute care venue OT Problem List: Decreased activity tolerance;Impaired balance (sitting and/or standing);Decreased cognition;Decreased safety awareness;Pain Barriers to Discharge: Decreased caregiver support OT Therapy Diagnosis : Generalized weakness;Cognitive deficits;Acute pain OT Plan OT Frequency: Min 1X/week OT Treatment/Interventions: Self-care/ADL training;Patient/family education OT Recommendation Follow Up Recommendations: Skilled nursing facility Equipment Recommended: Defer to next venue Individuals Consulted Consulted and Agree with Results and Recommendations: Patient OT Goals Acute Rehab OT Goals OT Goal Formulation: With patient Time For Goal Achievement: 2 weeks ADL Goals Pt Will Perform Grooming: with supervision;Standing at sink ADL Goal: Grooming - Progress: Goal set today Pt Will Perform Upper Body Bathing: with supervision;Sitting, edge of bed ADL Goal:  Upper Body Bathing - Progress: Goal set today Pt Will Perform Lower Body Bathing: with supervision;Sitting, edge of bed;Sit to stand from bed ADL Goal: Lower Body Bathing - Progress: Goal set today Pt Will Perform Upper Body Dressing: with supervision;Sitting, bed ADL Goal: Upper Body Dressing - Progress: Goal set today Pt Will Perform Lower Body Dressing: with supervision;Sitting, bed;Sit to stand from bed ADL Goal: Lower Body Dressing - Progress: Goal set today Pt Will Transfer to Toilet: with supervision;Regular height toilet;Ambulation ADL Goal: Toilet Transfer - Progress: Goal set today  OT Evaluation Precautions/Restrictions  Precautions Precautions: Fall Restrictions Weight Bearing Restrictions: No Prior Functioning Home Living Lives With: Son;Other (Comment) (son has schizophrenia and bipolar disorder) Receives Help From: Family Type of Home: House Home Layout: One level Home Access: Stairs to enter Entrance Stairs-Rails: Doctor, general practice of Steps: 4 Bathroom Shower/Tub: Forensic scientist: Standard Home Adaptive Equipment: Straight cane;Shower chair without back;Grab bars in shower Prior Function Level of Independence: Independent with gait;Independent with homemaking with ambulation;Requires assistive device for independence Driving: No ADL ADL Eating/Feeding: Simulated;Independent Where Assessed - Eating/Feeding: Chair Grooming: Performed;Wash/dry hands;Brushing hair Where Assessed - Grooming: Sitting, chair Upper Body Bathing: Simulated;Minimal assistance Where Assessed - Upper Body Bathing: Sitting, chair Lower Body Bathing: Simulated;Minimal assistance Where Assessed - Lower Body Bathing: Sitting, chair;Sit to stand from chair Upper Body Dressing: Performed;Minimal assistance Where Assessed - Upper Body Dressing: Sitting, chair Lower Body Dressing: Performed;Minimal assistance Where Assessed - Lower Body Dressing:  Sitting, chair;Sit to stand from chair Toilet Transfer: Performed;Minimal assistance Toilet Transfer Method: Proofreader: Bedside commode Toileting - Clothing Manipulation: Minimal assistance;Performed Where Assessed - Glass blower/designer Manipulation: Standing Toileting - Hygiene: Performed;Supervision/safety Where Assessed - Toileting Hygiene: Sit on 3-in-1 or toilet Equipment Used: Rolling walker Ambulation Related to  ADLs: min assist to ambulate with RW ADL Comments: Pts ADL independence limited by deconditioning and decreased activity tolerance as well as impaired balance. Vision/Perception  Vision - History Baseline Vision: Wears glasses all the time Patient Visual Report: No change from baseline Cognition Cognition Arousal/Alertness: Awake/alert Overall Cognitive Status: Impaired Safety/Judgement: Decreased awareness of safety precautions;Decreased safety judgement for tasks assessed Decreased Safety/Judgement: Decreased awareness of need for assistance Awareness of Deficits: Decreased awareness of deficits Cognition - Other Comments: general decline in cognitive status Sensation/Coordination Coordination Gross Motor Movements are Fluid and Coordinated: Yes Fine Motor Movements are Fluid and Coordinated: Yes Extremity Assessment RUE Assessment RUE Assessment: Within Functional Limits LUE Assessment LUE Assessment: Within Functional Limits Mobility  Bed Mobility Bed Mobility: No Transfers Sit to Stand: 5: Supervision;With upper extremity assist;From chair/3-in-1;With armrests Stand to Sit: 5: Supervision;With upper extremity assist;To chair/3-in-1  End of Session OT - End of Session Equipment Utilized During Treatment: Gait belt Activity Tolerance: Patient limited by fatigue Patient left: in chair;with family/visitor present;with call bell in reach General Behavior During Session: [facility] for tasks performed Cognition: Impaired, at baseline    Evern Bio 07/13/2011, 2:03 PM [phone]

## 2011-07-13 NOTE — Progress Notes (Signed)
INITIAL ADULT NUTRITION ASSESSMENT Date: 07/13/2011   Time: 9:26 AM Reason for Assessment: Consult  ASSESSMENT: Female 78 y.o.  Dx: Generalized weakness and lower back pain  Hx:  Past Medical History  Diagnosis Date  . Hiatal hernia   . Glaucoma     Related Meds:     . acetaminophen  650 mg Oral Q6H   Or  . acetaminophen  650 mg Rectal Q6H  . antiseptic oral rinse  15 mL Mouth Rinse BID  . calcitonin (salmon)  1 spray Alternating Nares Daily  . calcium-vitamin D  1 tablet Oral TID WC  . docusate sodium  100 mg Oral BID  . feeding supplement  237 mL Oral TID WC  . heparin  5,000 Units Subcutaneous Q8H  . influenza  inactive virus vaccine  0.5 mL Intramuscular Tomorrow-1000     Ht: 5\' 4"  (162.6 cm)  Wt: 124 lb 8 oz (56.473 kg)  Ideal Wt: 54.5 kg  % Ideal Wt: 103.7%  Usual Wt: 56.8 kg % Usual Wt: 99.4%  Body mass index is 21.37 kg/(m^2).  Food/Nutrition Related Hx: Pt did majority of cooking for herself and son at home. Minimal appetite prior to admit per pt.  MD reports functioning has been progressively declining over past 6 months d/t dementia.  Labs:  CMP     Component Value Date/Time   NA 133* 07/13/2011 0553   K 4.0 07/13/2011 0553   CL 102 07/13/2011 0553   CO2 24 07/13/2011 0553   GLUCOSE 117* 07/13/2011 0553   BUN 18 07/13/2011 0553   CREATININE 0.69 07/13/2011 0553   CALCIUM 8.3* 07/13/2011 0553   PROT 7.3 07/11/2011 1801   ALBUMIN 2.6* 07/11/2011 1801   AST 16 07/11/2011 1801   ALT 9 07/11/2011 1801   ALKPHOS 117 07/11/2011 1801   BILITOT 0.3 07/11/2011 1801   GFRNONAA 72* 07/13/2011 0553   GFRAA 84* 07/13/2011 0553     Intake:   Intake/Output Summary (Last 24 hours) at 07/13/11 0934 Last data filed at 07/13/11 0900  Gross per 24 hour  Intake   2100 ml  Output    251 ml  Net   1849 ml    Diet Order: Regular  Supplements/Tube Feeding: Ensure Clinical Strength 3 times daily with meals  IVF:    sodium chloride Last Rate: 75 mL/hr at 07/13/11  0344    Estimated Nutritional Needs:   Kcal:1185-1385 Protein:59-69 Fluid: > 1.2 L  Patient reports having a minimal appetite prior to admission, and experiences early satiety. She states she feels hungriest around 3PM, and liked the idea of having the Ensure as a snack between meals.  Eating about 50% of meals.    RN reports patient has had an overall good appetite and consumes her ordered supplements.    NUTRITION DIAGNOSIS: -Inadequate oral intake (NI-2.1).  Status: Resolved  RELATED TO: onset of dementia  AS EVIDENCE BY: 50% of meals consumed  MONITORING/EVALUATION(Goals): Goal: Consume >/= 90% of estimated needs Monitor: PO intake, weights  EDUCATION NEEDS: -No education needs identified at this time  INTERVENTION: Adjust Ensure Clinical Strength to be delivered three times daily in between meals  Dietitian #[phone]  DOCUMENTATION CODES Per approved criteria  -Not Applicable    Lloyd Huger 07/13/2011, 9:26 AM  Derrell Lolling Anastasia Fiedler [phone]

## 2011-07-13 NOTE — Progress Notes (Signed)
Clinical Child psychotherapist (CSW) completed psychosocial assessment which can be found in pt shadow chart. CSW met with pt, and pt sons Amada Jupiter and Alinda Money in pt room to discuss placement options. CSW informed pt and family that PT has recommended 24hr care however CSW was informed that pt son Amada Jupiter is unable to provide that care due to his own medical issues. CSW provided pt son with a list of skilled facilities in [place]. Pt appeared very hesitatant about leaving her son Amada Jupiter at home however older brother Alinda Money stated he would arrange care Amada Jupiter. Pt agreed to placement and CSW faxing out. CSW will follow up with bed offers for ST rehab.  Theresia Bough, MSW, Theresia Majors [phone]

## 2011-07-13 NOTE — Progress Notes (Signed)
PGY-1 Daily Progress Note Family Medicine Teaching Service Jamie Bass. Marti Sleigh, MD Service Pager: [phone]   Subjective: Continues to report no pain at rest. Slight pain with movement in lower back but still minimal with sitting. Patient requesting to go home today.   Objective:  Temp:  [97.4 F (36.3 C)-97.8 F (36.6 C)] 97.7 F (36.5 C) (01/21 0446) Pulse Rate:  [77-95] 84  (01/21 0446) Resp:  [18-19] 18  (01/21 0446) BP: (109-154)/(65-75) 153/66 mmHg (01/21 0446) SpO2:  [95 %-98 %] 96 % (01/21 0446) Weight:  [124 lb 8 oz (56.473 kg)] 124 lb 8 oz (56.473 kg) (01/20 2030)  Intake/Output Summary (Last 24 hours) at 07/13/11 0849 Last data filed at 07/13/11 0700  Gross per 24 hour  Intake   2460 ml  Output    251 ml  Net   2209 ml    Gen: NAD, thin elderly female  HEENT: moist mucous membranes. Multiple cavities  CV: Regular rate and rhythm, no murmurs rubs or gallops  PULM: minimal crackles in RLL. No wheezes. Breathing not labored  ABD: soft/nontender/nondistended/normal bowel sounds  EXT: Trace edema  Neuro: Alert to person and place. Timing-only knows the month.  Gait-not unsteady, patient also gets in and out of bed without difficulty   Labs and imaging:   CBC  Lab 07/12/11 0500 07/11/11 1801  WBC 7.9 12.1*  HGB 11.3* 11.9*  HCT 34.4* 35.8*  PLT 342 365   BMET  Lab 07/13/11 0553 07/12/11 0500 07/11/11 1801  NA 133* 130* 126*  K 4.0 3.8 3.9  CL 102 98 91*  CO2 24 26 27   BUN 18 20 26*  CREATININE 0.69 0.87 0.97  LABGLOM -- -- --  GLUCOSE 117* -- --  CALCIUM 8.3* 8.7 8.9   Results for orders placed during the hospital encounter of 07/11/11 (from the past 24 hour(s))  VITAMIN B12     Status: Normal   Collection Time   07/12/11  8:55 AM      Component Value Range   Vitamin B-12 475  211 - 911 (pg/mL)  RPR     Status: Normal   Collection Time   07/12/11  8:55 AM      Component Value Range   RPR NON REACTIVE  NON REACTIVE    Dg Chest 2  View  07/11/2011  *RADIOLOGY REPORT*  Clinical Data: Low back pain.  No known trauma.  CHEST - 2 VIEW  Comparison: None.  Findings: Normal heart size with calcified tortuous aorta.  Mild scarring or subsegmental atelectasis, left base laterally but no infiltrates or failure.  No effusion or pneumothorax.  No visible thoracic spine compression fractures. Please see lumbar spine dictation for additional findings.  IMPRESSION: Chronic changes.  No active infiltrates.  No visible thoracic compression fracture. L1 compression fracture suspected.  Original Report Authenticated By: Elsie Stain, M.D.   Dg Lumbar Spine Complete  07/11/2011  *RADIOLOGY REPORT*  Clinical Data: Low back pain  LUMBAR SPINE - COMPLETE 4+ VIEW  Comparison: None.  Findings: Five lumbar type vertebral bodies are present.  The bones appear demineralized.  There is a wedge deformity of L1 with loss of approximately 50% vertebral body height, age indeterminate. Disc space narrowing L2-L3 and L1-L2.  Mild 3 mm facet mediated slip L4 on L5 is observed.  Vascular calcification is seen.  IMPRESSION: Age indeterminate L1 compression fracture.  3 mm facet mediated slip L4-L5.  Per CMS PQRS reporting requirements (PQRS Measure 24): Given the  patient's age of greater than 50 and the fracture site (hip, distal radius, or spine), the patient should be tested for osteoporosis using DXA, and the appropriate treatment considered based on the DXA results.  Original Report Authenticated By: Elsie Stain, M.D.   Ct Head Wo Contrast  07/12/2011  *RADIOLOGY REPORT*  Clinical Data: Dementia, concern for stroke  CT HEAD WITHOUT CONTRAST  Technique:  Contiguous axial images were obtained from the base of the skull through the vertex without contrast.  Comparison: None.  Findings: Motion degraded images.  No evidence of parenchymal hemorrhage or extra-axial fluid collection. No mass lesion, mass effect, or midline shift.  No CT evidence of acute infarction.   Subcortical white matter and periventricular small vessel ischemic changes.  Age related atrophy.  No ventriculomegaly.  The visualized paranasal sinuses are essentially clear. The mastoid air cells are unopacified.  No evidence of calvarial fracture.  IMPRESSION: Motion degraded images.  No evidence of acute intracranial abnormality.  Age related atrophy with small vessel ischemic changes.  Original Report Authenticated By: Charline Bills, M.D.     Assessment  78 y.o. year old female without an established PCP with generalized weakness, progressive decline in memory and function over last 6 months, and low back pain found to be dehydrated and hyponatremic as well as to have an L1 compression fracture and L4-L5 facet mediated slipped disc.   1. Weakness/deconditioning/placement needs-likely multifactorial including aging, hyponatremia, dehydration, deconditioning, recent URI, back pain preventing movement. Patient reports doing 6 pushups within the last few days, but cannot verify this. Unsure how much of decline is weakness vs. Memory. Patient with good movement and strength this morning.  *PT/OT to evaluate patient  *PT-recommends 24 hour supervision, HHPT. Family unsure if they can provide 24 hour supervision  *OT recs pending *consult to SW for possible placment given primary caregiver with limitations.   2. Hyponatremia- Na 126 in ED. believed to be hypovolemic hyponatremia. Patient also with tea and toast diet likely contributing. S/p 1L bolus in ED. Hydrated with NS at 75cc per hour overnight with Sodium improved to 133 this AM. Will continue NS and recheck AM BMET.  3. Low back pain-L1 compression fracture and L4-L5 facet mediated slipped disc  *scheduled tylenol. *calcitonin  *also has prn morphine ordered at 0.5mg  q4 hours. Has received x2. Last dose at 10pm.  *bisphosphonate at discharge  *calcium and vitamin D supplementation. Pending Vitamin D level.   4. Progressive decline in  mental status with concern for dementia-MMSE 21-no acute change in mental status per family. Family with concerns for dementia and have PCP appt on Friday. Will start dementia workup  *CT Head-NAICA, age related atrophy with small vessel ischemic changes.  *TSH wnl, B12 wnl, RPR nonreactive *pending folic acid  4. Leukocytosis-now resolved with hydration. UA with small leukocytes but asymptomatic. CXR without acute process. No signs of skin breakdown. Will not continue doxycycline as prescribed by urgent care for URI symptoms   5. Dehydration/malnutrition-started on ensure shakes and consulted nutrition. Prealbumin 12.0.   FEN/GI-NS at 50/hr. REgular diet with ensure shakes.  PPx-heparin sq  Dispo-pending further evaluation   Tana Conch, MD PGY1, Family Medicine Teaching Service [phone]

## 2011-07-13 NOTE — Plan of Care (Signed)
Problem: Phase II Progression Outcomes Goal: Progress activity as tolerated unless otherwise ordered Outcome: Progressing Pt continues to participate in PT, able to walk 63' with RW and Min A, but is limited by fatigue and deconditioning. Pt will benefit from further PT to address activity tolerance, there ex, and static/dynamic balance.  [place], [place] [phone]  07/13/2011 10:05 AM

## 2011-07-13 NOTE — Progress Notes (Signed)
FMTS Attending Note  Patient seen and examined by me, I agree with Dr. Erasmo Leventhal assessment and plan.  Patient denies any back pain at this time.  Reports that she performs her own ADLs at home; lives with son.  Patient requires 24hour supervision and assistance at this time per PT evaluation.  For placement/services assessment prior to discharge.   Paula Compton, M.D.

## 2011-07-13 NOTE — Progress Notes (Addendum)
Physical Therapy Treatment Patient Details Name: Jamie Bass MRN: [id number] DOB: [birth year] Today's Date: 07/13/2011  PT Assessment/Plan  PT - Assessment/Plan Comments on Treatment Session: Pt moves quite well, but relies on RW for support and is limited by fatigue and deconditioning. Dementia limiting pt's safety awareness. Discussed DC disposition with pt, who did not recall that son has bipolar disorder and schizophrenia. I called her POA, son Jamie Bass, who confirmed her other son is unable to care for her and she will likely need SNF at DC. Pt will benefit from continued therapy for activity tolerance, ther ex, static and dynamic balance.  PT Plan: Discharge plan needs to be updated; frequency updated PT Frequency: Min 3X/week Follow Up Recommendations: Skilled nursing facility;Supervision/Assistance - 24 hour Equipment Recommended: Defer to next venue PT Goals  Acute Rehab PT Goals PT Goal: Supine/Side to Sit - Progress: Progressing toward goal PT Goal: Sit to Supine/Side - Progress:  (Not addressed) PT Goal: Sit to Stand - Progress: Met PT Goal: Stand to Sit - Progress: Met PT Goal: Ambulate - Progress: Progressing toward goal PT Goal: Up/Down Stairs - Progress: Not met  PT Treatment Precautions/Restrictions  Precautions Precautions: Fall Restrictions Weight Bearing Restrictions: No Mobility (including Balance) Bed Mobility Bed Mobility: Yes Supine to Sit: 5: Supervision Supine to Sit Details (indicate cue type and reason): Cues for sequencing to logroll to protect back Transfers Sit to Stand: 5: Supervision;With upper extremity assist;From chair/3-in-1;From bed;With armrests Sit to Stand Details (indicate cue type and reason): Cues for safe hand placement, able to recall later in tx Stand to Sit: 5: Supervision;With upper extremity assist;To chair/3-in-1 Stand to Sit Details: Cues to complete turn with RW, safe hand placement noted Ambulation/Gait Ambulation/Gait:  Yes Ambulation/Gait Assistance: 4: Min assist Ambulation/Gait Assistance Details (indicate cue type and reason): Min A for steadying as pt tends to lean forward with RW outside BOS. Cues for RW proximity x4 and posture. Distance limited due to fatigue Ambulation Distance (Feet): 80 Feet Assistive device: Rolling walker Gait Pattern: Antalgic;Shuffle;Trunk flexed Stairs: No  Balance Balance Assessed: Yes Static Standing Balance Static Standing - Balance Support: No upper extremity supported Static Standing - Level of Assistance: 4: Min assist Tandem Stance - Right Leg: 15  Tandem Stance - Left Leg: 15  Dynamic Standing Balance Dynamic Standing - Balance Support: Bilateral upper extremity supported Dynamic Standing - Level of Assistance: 3: Mod assist Dynamic Standing - Balance Activities:  Best boy) Exercise  General Exercises - Lower Extremity Ankle Circles/Pumps: Supine;20 reps;Both;Strengthening;AROM Long Arc Quad: Seated;20 reps;Both;Strengthening;AROM (Cues for full ROM) Heel Slides: Supine;20 reps;Both;Strengthening;AROM (Cues for technique, pt tends to bicycle in air) Hip Flexion/Marching: Seated;Standing;10 reps;Both;Strengthening;AROM (standing wtih 1UE A) Mini-Sqauts: Standing;10 reps;Both;Strengthening;AROM (No UE A) End of Session PT - End of Session Equipment Utilized During Treatment: Gait belt Activity Tolerance: Patient limited by fatigue ("I'm weak.") Patient left: in chair;with call bell in reach Nurse Communication: Mobility status for transfers;Mobility status for ambulation General Behavior During Session: [facility] for tasks performed Cognition: Impaired, at baseline  [facility] [place], [place] [phone]  07/13/2011, 10:03 AM

## 2011-07-14 DIAGNOSIS — R4189 Other symptoms and signs involving cognitive functions and awareness: Secondary | ICD-10-CM | POA: Diagnosis present

## 2011-07-14 DIAGNOSIS — S32000A Wedge compression fracture of unspecified lumbar vertebra, initial encounter for closed fracture: Secondary | ICD-10-CM | POA: Diagnosis present

## 2011-07-14 DIAGNOSIS — E871 Hypo-osmolality and hyponatremia: Secondary | ICD-10-CM | POA: Diagnosis present

## 2011-07-14 LAB — BASIC METABOLIC PANEL
BUN: 12 mg/dL (ref 6–23)
Calcium: 8.7 mg/dL (ref 8.4–10.5)
Creatinine, Ser: 0.75 mg/dL (ref 0.50–1.10)
GFR calc Af Amer: 81 mL/min — ABNORMAL LOW (ref >90)
GFR calc non Af Amer: 70 mL/min — ABNORMAL LOW (ref >90)

## 2011-07-14 MED ORDER — ALENDRONATE SODIUM 70 MG PO TABS: 70.0000 mg | ORAL_TABLET | ORAL | Status: DC

## 2011-07-14 MED ORDER — ENSURE CLINICAL ST REVIGOR PO LIQD: 237.0000 mL | Freq: Three times a day (TID) | ORAL | Status: DC

## 2011-07-14 MED ORDER — ACETAMINOPHEN 325 MG PO TABS: 650.0000 mg | ORAL_TABLET | Freq: Four times a day (QID) | ORAL | Status: DC | PRN

## 2011-07-14 MED ORDER — CALCITONIN (SALMON) 200 UNIT/ACT NA SOLN: 1.0000 | Freq: Every day | NASAL | Status: DC

## 2011-07-14 MED ORDER — CALCIUM CARBONATE-VITAMIN D 500-200 MG-UNIT PO TABS: 1.0000 | ORAL_TABLET | Freq: Three times a day (TID) | ORAL | Status: DC

## 2011-07-14 NOTE — Progress Notes (Signed)
Clinical Child psychotherapist (CSW) confirmed that pt ready for transportation. CSW has contacted [facility] for transportation and CSW is signing off.  Theresia Bough, MSW, Theresia Majors [phone]

## 2011-07-14 NOTE — Progress Notes (Signed)
Clinical Social Worker (CSW) has been speaking with family regarding placement options. CSW provided pt son Alinda Money with bed offers however 1st choice was not available.  CSW informed that pt son is currently visiting a few facilities in the area and will inform CSW of which facility family decides. CSW will assist with dc.  Theresia Bough, MSW, Theresia Majors [phone]

## 2011-07-14 NOTE — Discharge Summary (Signed)
Physician Discharge Summary  Patient ID: Jamie Bass MRN: [id number] DOB: [birth year] Age: 78 y.o.  Admit date: 07/11/2011 Discharge date: 07/14/2011  PCP: Kaleen Mask, MD, MD   Discharge Diagnosis: Principal Problem:  *Lumbar compression fracture Active Problems:  Hyponatremia  Cognitive decline Osteoporosis   Hospital Course 78 y.o. year old female without an established PCP with generalized weakness, progressive decline in memory and function over last 6 months, and low back pain found to be dehydrated and hyponatremic as well as to have an L1 compression fracture and L4-L5 facet mediated slipped disc.   1. Weakness/deconditioning/placement needs-likely multifactorial including aging, hyponatremia, dehydration, deconditioning, recent URI, back pain preventing movement. Patient reports doing 14 pushups before hospitalization, but cannot verify this. Unsure how much of decline is weakness vs. Memory. 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 She is no longer able to provide this care and has no one at home to provide 24 hour supervision at this time.  *PT and OT recommend SNF for short term rehab  *SW arranged SNF placement  2. Hyponatremia- Na 126 in ED. believed to be hypovolemic hyponatremia. Patient also with tea and toast diet likely contributing. S/p 1L bolus in ED. Hydrated with NS at 75cc initially with gradual decrease in IV to saline lock with Sodium improved to 133 and stable at that level for 2 days before discharge.   3. Low back pain-L1 compression fracture and L4-L5 facet mediated slipped disc  *scheduled tylenol during hospitalization with good pain control. Did receive 2 doses of 0.5mg  Morphine early in hospitalization as well but did not require for over 24 hours before discharge.  *calcitonin to be continued for 2-4 weeks.  *Fosamax at discharge *calcium and vitamin D supplementation. Vitamin D 24 low.   4. Progressive decline in  mental status with concern for dementia-MMSE 21-no acute change in mental status per family. Family with concerns for dementia and have PCP appt on Friday. Started dementia workup for reversible causes in house.  *CT Head-NAICA, age related atrophy with small vessel ischemic changes.  *TSH wnl, B12 wnl, RPR nonreactive, folate not decreased  5. Leukocytosis-resolved with hydration. UA with small leukocytes but asymptomatic. CXR without acute process. No signs of skin breakdown. Will not continue doxycycline as prescribed by urgent care for URI symptoms  6. Dehydration/malnutrition-started on ensure shakes Between meals per nutrition. Prealbumin 12.0.     Procedures/Imaging:  Dg Chest 2 View  07/11/2011  *RADIOLOGY REPORT*  Clinical Data: Low back pain.  No known trauma.  CHEST - 2 VIEW  Comparison: None.  Findings: Normal heart size with calcified tortuous aorta.  Mild scarring or subsegmental atelectasis, left base laterally but no infiltrates or failure.  No effusion or pneumothorax.  No visible thoracic spine compression fractures. Please see lumbar spine dictation for additional findings.  IMPRESSION: Chronic changes.  No active infiltrates.  No visible thoracic compression fracture. L1 compression fracture suspected.  Original Report Authenticated By: Elsie Stain, M.D.   Dg Lumbar Spine Complete  07/11/2011  *RADIOLOGY REPORT*  Clinical Data: Low back pain  LUMBAR SPINE - COMPLETE 4+ VIEW  Comparison: None.  Findings: Five lumbar type vertebral bodies are present.  The bones appear demineralized.  There is a wedge deformity of L1 with loss of approximately 50% vertebral body height, age indeterminate. Disc space narrowing L2-L3 and L1-L2.  Mild 3 mm facet mediated slip L4 on L5 is observed.  Vascular calcification is seen.  IMPRESSION: Age indeterminate L1 compression  fracture.  3 mm facet mediated slip L4-L5.  Per CMS PQRS reporting requirements (PQRS Measure 24): Given the patient's age of  greater than 50 and the fracture site (hip, distal radius, or spine), the patient should be tested for osteoporosis using DXA, and the appropriate treatment considered based on the DXA results.  Original Report Authenticated By: Elsie Stain, M.D.   Ct Head Wo Contrast  07/12/2011  *RADIOLOGY REPORT*  Clinical Data: Dementia, concern for stroke  CT HEAD WITHOUT CONTRAST  Technique:  Contiguous axial images were obtained from the base of the skull through the vertex without contrast.  Comparison: None.  Findings: Motion degraded images.  No evidence of parenchymal hemorrhage or extra-axial fluid collection. No mass lesion, mass effect, or midline shift.  No CT evidence of acute infarction.  Subcortical white matter and periventricular small vessel ischemic changes.  Age related atrophy.  No ventriculomegaly.  The visualized paranasal sinuses are essentially clear. The mastoid air cells are unopacified.  No evidence of calvarial fracture.  IMPRESSION: Motion degraded images.  No evidence of acute intracranial abnormality.  Age related atrophy with small vessel ischemic changes.  Original Report Authenticated By: Charline Bills, M.D.    Labs  CBC  Lab 07/12/11 0500 07/11/11 1801  WBC 7.9 12.1*  HGB 11.3* 11.9*  HCT 34.4* 35.8*  PLT 342 365   BMET  Lab 07/14/11 0615 07/13/11 0553 07/12/11 0500 07/11/11 1801  NA 133* 133* 130* --  K 4.0 4.0 3.8 --  CL 99 102 98 --  CO2 26 24 26  --  BUN 12 18 20  --  CREATININE 0.75 0.69 0.87 --  CALCIUM 8.7 8.3* 8.7 --  PROT -- -- -- 7.3  BILITOT -- -- -- 0.3  ALKPHOS -- -- -- 117  ALT -- -- -- 9  AST -- -- -- 16  GLUCOSE 107* 117* 100* --   Results for orders placed during the hospital encounter of 07/11/11 (from the past 72 hour(s))  CBC     Status: Abnormal   Collection Time   07/11/11  6:01 PM      Component Value Range Comment   WBC 12.1 (*) 4.0 - 10.5 (K/uL)    RBC 3.90  3.87 - 5.11 (MIL/uL)    Hemoglobin 11.9 (*) 12.0 - 15.0 (g/dL)    HCT  16.1 (*) 09.6 - 46.0 (%)    MCV 91.8  78.0 - 100.0 (fL)    MCH 30.5  26.0 - 34.0 (pg)    MCHC 33.2  30.0 - 36.0 (g/dL)    RDW 04.5  40.9 - 81.1 (%)    Platelets 365  150 - 400 (K/uL)   COMPREHENSIVE METABOLIC PANEL     Status: Abnormal   Collection Time   07/11/11  6:01 PM      Component Value Range Comment   Sodium 126 (*) 135 - 145 (mEq/L)    Potassium 3.9  3.5 - 5.1 (mEq/L)    Chloride 91 (*) 96 - 112 (mEq/L)    CO2 27  19 - 32 (mEq/L)    Glucose, Bld 107 (*) 70 - 99 (mg/dL)    BUN 26 (*) 6 - 23 (mg/dL)    Creatinine, Ser 9.14  0.50 - 1.10 (mg/dL)    Calcium 8.9  8.4 - 10.5 (mg/dL)    Total Protein 7.3  6.0 - 8.3 (g/dL)    Albumin 2.6 (*) 3.5 - 5.2 (g/dL)    AST 16  0 - 37 (U/L)  ALT 9  0 - 35 (U/L)    Alkaline Phosphatase 117  39 - 117 (U/L)    Total Bilirubin 0.3  0.3 - 1.2 (mg/dL)    GFR calc non Af Amer 48 (*) >90 (mL/min)    GFR calc Af Amer 56 (*) >90 (mL/min)   LIPASE, BLOOD     Status: Normal   Collection Time   07/11/11  6:01 PM      Component Value Range Comment   Lipase 44  11 - 59 (U/L)   TSH     Status: Normal   Collection Time   07/11/11  6:01 PM      Component Value Range Comment   TSH 4.108  0.350 - 4.500 (uIU/mL)   LACTIC ACID, PLASMA     Status: Normal   Collection Time   07/11/11  6:02 PM      Component Value Range Comment   Lactic Acid, Venous 1.3  0.5 - 2.2 (mmol/L)   TROPONIN I     Status: Normal   Collection Time   07/11/11  6:06 PM      Component Value Range Comment   Troponin I <0.30  <0.30 (ng/mL)   URINALYSIS, ROUTINE W REFLEX MICROSCOPIC     Status: Abnormal   Collection Time   07/11/11  6:20 PM      Component Value Range Comment   Color, Urine AMBER (*) YELLOW  BIOCHEMICALS MAY BE AFFECTED BY COLOR   APPearance CLOUDY (*) CLEAR     Specific Gravity, Urine 1.027  1.005 - 1.030     pH 5.0  5.0 - 8.0     Glucose, UA NEGATIVE  NEGATIVE (mg/dL)    Hgb urine dipstick NEGATIVE  NEGATIVE     Bilirubin Urine SMALL (*) NEGATIVE     Ketones,  ur 15 (*) NEGATIVE (mg/dL)    Protein, ur 30 (*) NEGATIVE (mg/dL)    Urobilinogen, UA 1.0  0.0 - 1.0 (mg/dL)    Nitrite NEGATIVE  NEGATIVE     Leukocytes, UA SMALL (*) NEGATIVE    URINE MICROSCOPIC-ADD ON     Status: Abnormal   Collection Time   07/11/11  6:20 PM      Component Value Range Comment   Squamous Epithelial / LPF FEW (*) RARE     WBC, UA 3-6  <3 (WBC/hpf)   PREALBUMIN     Status: Abnormal   Collection Time   07/12/11 12:05 AM      Component Value Range Comment   Prealbumin 12.0 (*) 17.0 - 34.0 (mg/dL)   BASIC METABOLIC PANEL     Status: Abnormal   Collection Time   07/12/11  5:00 AM      Component Value Range Comment   Sodium 130 (*) 135 - 145 (mEq/L)    Potassium 3.8  3.5 - 5.1 (mEq/L)    Chloride 98  96 - 112 (mEq/L)    CO2 26  19 - 32 (mEq/L)    Glucose, Bld 100 (*) 70 - 99 (mg/dL)    BUN 20  6 - 23 (mg/dL)    Creatinine, Ser 4.09  0.50 - 1.10 (mg/dL)    Calcium 8.7  8.4 - 10.5 (mg/dL)    GFR calc non Af Amer 55 (*) >90 (mL/min)    GFR calc Af Amer 64 (*) >90 (mL/min)   CBC     Status: Abnormal   Collection Time   07/12/11  5:00 AM      Component Value  Range Comment   WBC 7.9  4.0 - 10.5 (K/uL)    RBC 3.74 (*) 3.87 - 5.11 (MIL/uL)    Hemoglobin 11.3 (*) 12.0 - 15.0 (g/dL)    HCT 40.9 (*) 81.1 - 46.0 (%)    MCV 92.0  78.0 - 100.0 (fL)    MCH 30.2  26.0 - 34.0 (pg)    MCHC 32.8  30.0 - 36.0 (g/dL)    RDW 91.4  78.2 - 95.6 (%)    Platelets 342  150 - 400 (K/uL)   FOLATE RBC     Status: Abnormal   Collection Time   07/12/11  8:55 AM      Component Value Range Comment   RBC Folate 1209 (*) >=366 (ng/mL) Reference range not established for pediatric patients.  VITAMIN B12     Status: Normal   Collection Time   07/12/11  8:55 AM      Component Value Range Comment   Vitamin B-12 475  211 - 911 (pg/mL)   RPR     Status: Normal   Collection Time   07/12/11  8:55 AM      Component Value Range Comment   RPR NON REACTIVE  NON REACTIVE    BASIC METABOLIC PANEL      Status: Abnormal   Collection Time   07/13/11  5:53 AM      Component Value Range Comment   Sodium 133 (*) 135 - 145 (mEq/L)    Potassium 4.0  3.5 - 5.1 (mEq/L)    Chloride 102  96 - 112 (mEq/L)    CO2 24  19 - 32 (mEq/L)    Glucose, Bld 117 (*) 70 - 99 (mg/dL)    BUN 18  6 - 23 (mg/dL)    Creatinine, Ser 2.13  0.50 - 1.10 (mg/dL)    Calcium 8.3 (*) 8.4 - 10.5 (mg/dL)    GFR calc non Af Amer 72 (*) >90 (mL/min)    GFR calc Af Amer 84 (*) >90 (mL/min)   VITAMIN D 25 HYDROXY     Status: Abnormal   Collection Time   07/13/11  9:15 AM      Component Value Range Comment   Vit D, 25-Hydroxy 24 (*) 30 - 89 (ng/mL)   BASIC METABOLIC PANEL     Status: Abnormal   Collection Time   07/14/11  6:15 AM      Component Value Range Comment   Sodium 133 (*) 135 - 145 (mEq/L)    Potassium 4.0  3.5 - 5.1 (mEq/L)    Chloride 99  96 - 112 (mEq/L)    CO2 26  19 - 32 (mEq/L)    Glucose, Bld 107 (*) 70 - 99 (mg/dL)    BUN 12  6 - 23 (mg/dL)    Creatinine, Ser 0.86  0.50 - 1.10 (mg/dL)    Calcium 8.7  8.4 - 10.5 (mg/dL)    GFR calc non Af Amer 70 (*) >90 (mL/min)    GFR calc Af Amer 81 (*) >90 (mL/min)        Patient condition at time of discharge/disposition: stable  Disposition-SNF   Follow up issues: 1. Please follow up to make sure patient compliant with calcium, vitamin d, and fosamax.  2. Please continue dementia workup. See problem #4 above for details on reversible causes of dementia workup. MMSE in hospital 21.  3. Please follow up low back pain control.  4. Follow up nutrition and compliance with ensure between meals. Prealbumin  of 12 in hospital.   Discharge follow up:  Follow-up Information    Follow up with Kaleen Mask, MD on 07/17/2011. (to establish care and continue dementia workup)    Contact information:   [address] [place] [address] [phone]         Discharge Orders    Future Orders Please Complete By Expires   Diet - low sodium  heart healthy      Increase activity slowly      Call MD for:  severe uncontrolled pain         Discharge Medications  Sharika, Mosqu[place]  Home Medication Instructions [id number]   Printed on:07/14/11 1017  Medication Information                    acetaminophen (TYLENOL) 325 MG tablet Take 2 tablets (650 mg total) by mouth every 6 (six) hours as needed for pain.           calcitonin, salmon, (MIACALCIN/FORTICAL) 200 UNIT/ACT nasal spray Place 1 spray into the nose daily. Take for 2-4 weeks after discharge.           calcium-vitamin D (OSCAL WITH D) 500-200 MG-UNIT per tablet Take 1 tablet by mouth 3 (three) times daily with meals.           feeding supplement (ENSURE CLINICAL STRENGTH) LIQD Take 237 mLs by mouth 3 (three) times daily between meals.           alendronate (FOSAMAX) 70 MG tablet Take 1 tablet (70 mg total) by mouth every 7 (seven) days. Take with a full glass of water on an empty stomach 2 hours before calcium/vit D.                Tana Conch, MD of [facility] 07/14/2011 10:17 AM

## 2011-07-14 NOTE — Progress Notes (Signed)
PGY-1 Daily Progress Note Family Medicine Teaching Service Aldine Contes. Marti Sleigh, MD Service Pager: [phone]   Subjective: no complaints this morning. No pain with sitting up. She did have pain previously.   Objective:  Temp:  [97.5 F (36.4 C)-98.1 F (36.7 C)] 98.1 F (36.7 C) (01/22 0421) Pulse Rate:  [76-86] 85  (01/22 0421) Resp:  [18-20] 18  (01/22 0421) BP: (135-174)/(65-81) 152/81 mmHg (01/22 0421) SpO2:  [95 %-99 %] 95 % (01/22 0421) Weight:  [129 lb 13.6 oz (58.9 kg)] 129 lb 13.6 oz (58.9 kg) (01/21 2100)  Intake/Output Summary (Last 24 hours) at 07/14/11 0958 Last data filed at 07/14/11 0700  Gross per 24 hour  Intake 1735.83 ml  Output    200 ml  Net 1535.83 ml    Gen: NAD, thin elderly female  HEENT: moist mucous membranes. Multiple cavities  CV: Regular rate and rhythm, no murmurs rubs or gallops  PULM: minimal crackles in RLL. No wheezes. Breathing not labored  ABD: soft/nontender/nondistended/normal bowel sounds  EXT: Trace edema  Neuro: Alert to person and place. Timing-only knows the month.     Labs and imaging:   CBC  Lab 07/12/11 0500 07/11/11 1801  WBC 7.9 12.1*  HGB 11.3* 11.9*  HCT 34.4* 35.8*  PLT 342 365   BMET  Lab 07/14/11 0615 07/13/11 0553 07/12/11 0500  NA 133* 133* 130*  K 4.0 4.0 3.8  CL 99 102 98  CO2 26 24 26   BUN 12 18 20   CREATININE 0.75 0.69 0.87  LABGLOM -- -- --  GLUCOSE 107* -- --  CALCIUM 8.7 8.3* 8.7   VITAMIN D 25 HYDROXY     Status: Abnormal   Collection Time   07/13/11  9:15 AM      Component Value Range   Vit D, 25-Hydroxy 24 (*) 30 - 89 (ng/mL)   Ct Head Wo Contrast  07/12/2011  *RADIOLOGY REPORT*  Clinical Data: Dementia, concern for stroke  CT HEAD WITHOUT CONTRAST  Technique:  Contiguous axial images were obtained from the base of the skull through the vertex without contrast.  Comparison: None.  Findings: Motion degraded images.  No evidence of parenchymal hemorrhage or extra-axial fluid collection.  No mass lesion, mass effect, or midline shift.  No CT evidence of acute infarction.  Subcortical white matter and periventricular small vessel ischemic changes.  Age related atrophy.  No ventriculomegaly.  The visualized paranasal sinuses are essentially clear. The mastoid air cells are unopacified.  No evidence of calvarial fracture.  IMPRESSION: Motion degraded images.  No evidence of acute intracranial abnormality.  Age related atrophy with small vessel ischemic changes.  Original Report Authenticated By: Charline Bills, M.D.     Assessment  78 y.o. year old female without an established PCP with generalized weakness, progressive decline in memory and function over last 6 months, and low back pain found to be dehydrated and hyponatremic as well as to have an L1 compression fracture and L4-L5 facet mediated slipped disc.   1. Weakness/deconditioning/placement needs-likely multifactorial including aging, hyponatremia, dehydration, deconditioning, recent URI, back pain preventing movement. Patient reports doing 75 pushups within the last few days, but cannot verify this. Unsure how much of decline is weakness vs. Memory. Patient with good movement and strength this morning.  *PT and OT recommend SNF for short term rehab *SW pending placement  2. Hyponatremia- Na 126 in ED. believed to be hypovolemic hyponatremia. Patient also with tea and toast diet likely contributing. S/p 1L bolus in  ED. Hydrated with NS at 75cc per hour overnight with Sodium improved to 133 and stable at that level today. Will saline lock IV and encourage PO.   3. Low back pain-L1 compression fracture and L4-L5 facet mediated slipped disc  *scheduled tylenol.  *calcitonin  *also has prn morphine ordered at 0.5mg  q4 hours. Has received x2 but none in 24 hours. Will discontinue.   *bisphosphonate at discharge  *calcium and vitamin D supplementation. Vitamin D 24 low.   4. Progressive decline in mental status with concern for  dementia-MMSE 21-no acute change in mental status per family. Family with concerns for dementia and have PCP appt on Friday. Will start dementia workup  *CT Head-NAICA, age related atrophy with small vessel ischemic changes.  *TSH wnl, B12 wnl, RPR nonreactive, folate not decreased  5. Leukocytosis-now resolved with hydration. UA with small leukocytes but asymptomatic. CXR without acute process. No signs of skin breakdown. Will not continue doxycycline as prescribed by urgent care for URI symptoms  6. Dehydration/malnutrition-started on ensure shakes  Between meals per nutrition. Prealbumin 12.0.   FEN/GI-will SLIV. REgular diet with ensure shakes.  PPx-heparin sq  Dispo-pending further evaluation   Tana Conch, MD PGY1, Family Medicine Teaching Service [phone]

## 2011-07-14 NOTE — Progress Notes (Signed)
I have seen and examined this patient. I have discussed with Dr Hunter.  I agree with their findings and plans as documented in their progress note for today.  

## 2011-07-14 NOTE — Discharge Summary (Signed)
I have seen and examined this patient. I have discussed with Dr Hunter.  I agree with their findings and plans as documented in their progress note for today.  

## 2011-07-14 NOTE — Progress Notes (Signed)
Clinical Child psychotherapist (CSW) received a call from [employer] that pt family visited the facility and will be filling out paper work for pt. CSW will fax pt dc summary and assist with transportation to facility. Theresia Bough, MSW, Theresia Majors [phone]

## 2011-07-15 LAB — VITAMIN D 1,25 DIHYDROXY
Vitamin D2 1, 25 (OH)2: <8 pg/mL
Vitamin D3 1, 25 (OH)2: 25 pg/mL

## 2011-08-29 ENCOUNTER — Other Ambulatory Visit: Payer: Self-pay

## 2011-08-29 ENCOUNTER — Encounter (HOSPITAL_COMMUNITY): Payer: Self-pay | Admitting: *Deleted

## 2011-08-29 ENCOUNTER — Emergency Department (HOSPITAL_COMMUNITY)
Admission: EM | Admit: 2011-08-29 | Discharge: 2011-08-29 | Disposition: A | Discharge status: Home / Self Care | Payer: Medicare Other | Attending: Emergency Medicine | Admitting: Emergency Medicine

## 2011-08-29 DIAGNOSIS — H409 Unspecified glaucoma: Secondary | ICD-10-CM | POA: Insufficient documentation

## 2011-08-29 DIAGNOSIS — F068 Other specified mental disorders due to known physiological condition: Secondary | ICD-10-CM | POA: Insufficient documentation

## 2011-08-29 DIAGNOSIS — R112 Nausea with vomiting, unspecified: Secondary | ICD-10-CM | POA: Insufficient documentation

## 2011-08-29 DIAGNOSIS — N390 Urinary tract infection, site not specified: Secondary | ICD-10-CM | POA: Insufficient documentation

## 2011-08-29 DIAGNOSIS — E871 Hypo-osmolality and hyponatremia: Secondary | ICD-10-CM | POA: Insufficient documentation

## 2011-08-29 HISTORY — DX: Unspecified macular degeneration: H35.30

## 2011-08-29 HISTORY — DX: Gastro-esophageal reflux disease without esophagitis: K21.9

## 2011-08-29 LAB — DIFFERENTIAL
Basophils Absolute: 0 10*3/uL (ref 0.0–0.1)
Basophils Relative: 0 % (ref 0–1)
Eosinophils Absolute: 0 10*3/uL (ref 0.0–0.7)
Eosinophils Relative: 0 % (ref 0–5)
Lymphocytes Relative: 4 % — ABNORMAL LOW (ref 12–46)
Monocytes Absolute: 0.4 10*3/uL (ref 0.1–1.0)

## 2011-08-29 LAB — POCT I-STAT, CHEM 8
BUN: 20 mg/dL (ref 6–23)
Creatinine, Ser: 0.7 mg/dL (ref 0.50–1.10)
Potassium: 3.8 mEq/L (ref 3.5–5.1)
Sodium: 135 mEq/L (ref 135–145)

## 2011-08-29 LAB — URINALYSIS, ROUTINE W REFLEX MICROSCOPIC
Glucose, UA: NEGATIVE mg/dL
Protein, ur: 30 mg/dL — AB
Specific Gravity, Urine: 1.029 (ref 1.005–1.030)
Urobilinogen, UA: 1 mg/dL (ref 0.0–1.0)

## 2011-08-29 LAB — COMPREHENSIVE METABOLIC PANEL
AST: 16 U/L (ref 0–37)
CO2: 26 mEq/L (ref 19–32)
Calcium: 8.5 mg/dL (ref 8.4–10.5)
Creatinine, Ser: 0.82 mg/dL (ref 0.50–1.10)
GFR calc non Af Amer: 59 mL/min — ABNORMAL LOW (ref >90)
Total Protein: 7.8 g/dL (ref 6.0–8.3)

## 2011-08-29 LAB — CBC
HCT: 35.8 % — ABNORMAL LOW (ref 36.0–46.0)
MCH: 31.1 pg (ref 26.0–34.0)
MCHC: 34.1 g/dL (ref 30.0–36.0)
MCV: 91.3 fL (ref 78.0–100.0)
Platelets: 318 10*3/uL (ref 150–400)
RDW: 14.5 % (ref 11.5–15.5)

## 2011-08-29 LAB — URINE MICROSCOPIC-ADD ON

## 2011-08-29 LAB — POCT I-STAT TROPONIN I: Troponin i, poc: 0 ng/mL (ref 0.00–0.08)

## 2011-08-29 MED ORDER — ONDANSETRON HCL 4 MG/2ML IJ SOLN
4.0000 mg | Freq: Once | INTRAMUSCULAR | Status: AC
  Administered 2011-08-29: 4 mg via INTRAVENOUS
  Filled 2011-08-29: qty 2

## 2011-08-29 MED ORDER — CEPHALEXIN 500 MG PO CAPS: 500.0000 mg | ORAL_CAPSULE | Freq: Four times a day (QID) | ORAL | Status: AC

## 2011-08-29 MED ORDER — ONDANSETRON HCL 8 MG PO TABS: 8.0000 mg | ORAL_TABLET | Freq: Three times a day (TID) | ORAL | Status: AC | PRN

## 2011-08-29 MED ORDER — SODIUM CHLORIDE 0.9 % IV BOLUS (SEPSIS)
1000.0000 mL | Freq: Once | INTRAVENOUS | Status: AC
  Administered 2011-08-29: 1000 mL via INTRAVENOUS

## 2011-08-29 NOTE — ED Notes (Signed)
EMS called to [facility] senior [facility].  Found patient in common area drinking ginger ale

## 2011-08-29 NOTE — Discharge Instructions (Signed)
Take antibiotic as prescribed for bladder infection.  Take zofran every four hours as needed for nausea.  Follow up with your primary care doctor if you continue to have vomiting.  You may return to the ER if your vomiting worsens or you develop associatedB.R.A.T. Diet Your doctor has recommended the B.R.A.T. diet for you or your child until the condition improves. This is often used to help control diarrhea and vomiting symptoms. If you or your child can tolerate clear liquids, you may have:  Bananas.   Rice.   Applesauce.   Toast (and other simple starches such as crackers, potatoes, noodles).  Be sure to avoid dairy products, meats, and fatty foods until symptoms are better. Fruit juices such as apple, grape, and prune juice can make diarrhea worse. Avoid these. Continue this diet for 2 days or as instructed by your caregiver. Document Released: 06/08/2005 Document Revised: 05/28/2011 Document Reviewed: 11/25/2006 [facility] Patient Information 2012 [place], [place]. fever, abdominal pain or chest pain.

## 2011-08-29 NOTE — ED Provider Notes (Signed)
History     CSN: [id number]  Arrival date & time 08/29/11  1335   First MD Initiated Contact with Patient 08/29/11 1412      Chief Complaint  Patient presents with  . Nausea  . Emesis    (Consider location/radiation/quality/duration/timing/severity/associated sxs/prior treatment) HPI History provided by pt, her son and nursing home.  Level 5 caveat applies because of dementia.  Pt transferred from SNF this am for several episodes of non-bloody emesis.  Per nursing home staff, pt has not tolerated any food/fluids this today.  She has not complained of pain nor had fever, dyspnea, diarrhea or urinary sx recently.  No sick contacts.  Pt denies having CP/abd pain.  She currently feels well.   No h/o MI.  Her son says that she takes aricept for short-term memory impairment and omeprazole for occasional reflux but she is otherwise healthy.   Past Medical History  Diagnosis Date  . Hiatal hernia   . Glaucoma   . Macular degeneration   . GERD (gastroesophageal reflux disease)     Past Surgical History  Procedure Date  . No past surgeries     History reviewed. No pertinent family history.  History  Substance Use Topics  . Smoking status: Never Smoker   . Smokeless tobacco: Never Used  . Alcohol Use: No    OB History    Grav Para Term Preterm Abortions TAB SAB Ect Mult Living                  Review of Systems  All other systems reviewed and are negative.    Allergies  Review of patient's allergies indicates no known allergies.  Home Medications   Current Outpatient Rx  Name Route Sig Dispense Refill  . ACETAMINOPHEN 325 MG PO TABS Oral Take 2 tablets (650 mg total) by mouth every 6 (six) hours as needed for pain. 30 tablet 2  . ALENDRONATE SODIUM 70 MG PO TABS Oral Take 1 tablet (70 mg total) by mouth every 7 (seven) days. Take with a full glass of water on an empty stomach 2 hours before calcium/vit D. 26 tablet 0  . CALCIUM CARBONATE-VITAMIN D 500-200 MG-UNIT PO  TABS Oral Take 1 tablet by mouth 3 (three) times daily with meals. 90 tablet 3  . VITAMIN D 1000 UNITS PO TABS Oral Take 2,000 Units by mouth daily.    . DONEPEZIL HCL 5 MG PO TABS Oral Take 5 mg by mouth at bedtime.    Idolina Primer PRESERVISION PO TABS Oral Take 1 tablet by mouth 2 (two) times daily.    Marland Kitchen OMEPRAZOLE 20 MG PO CPDR Oral Take 20 mg by mouth daily.    Marland Kitchen OVER THE COUNTER MEDICATION Oral Take 1 capsule by mouth daily. Bacopa 66.5mg  for alertness.      BP 134/74  Pulse 93  Temp(Src) 97.4 F (36.3 C) (Oral)  Resp 18  SpO2 96%  Physical Exam  Nursing note and vitals reviewed. Constitutional: She is oriented to person, place, and time. She appears well-developed and well-nourished. No distress.  HENT:  Head: Normocephalic and atraumatic.  Mouth/Throat: Oropharynx is clear and moist.  Eyes:       Normal appearance  Neck: Normal range of motion.  Cardiovascular: Normal rate and regular rhythm.   Pulmonary/Chest: Effort normal and breath sounds normal.  Abdominal: Soft. Bowel sounds are normal. She exhibits no distension and no mass. There is no tenderness. There is no rebound and no guarding.  No CVA tenderness  Musculoskeletal:       No peripheral edema or tenderness  Neurological: She is alert and oriented to person, place, and time.  Skin: Skin is warm and dry. No rash noted.  Psychiatric: She has a normal mood and affect. Her behavior is normal.    ED Course  Procedures (including critical care time)   Date: 08/29/2011  Rate: 87  Rhythm: normal sinus rhythm and PVCs  QRS Axis: normal  Intervals: normal  ST/T Wave abnormalities: normal  Conduction Disutrbances:left bundle branch block  Narrative Interpretation:   Old EKG Reviewed: unchanged   Labs Reviewed  CBC - Abnormal; Notable for the following:    WBC 11.9 (*)    HCT 35.8 (*)    All other components within normal limits  DIFFERENTIAL - Abnormal; Notable for the following:    Neutrophils Relative  93 (*)    Neutro Abs 11.0 (*)    Lymphocytes Relative 4 (*)    Lymphs Abs 0.5 (*)    All other components within normal limits  COMPREHENSIVE METABOLIC PANEL - Abnormal; Notable for the following:    Sodium 128 (*)    Chloride 93 (*)    Glucose, Bld 129 (*)    Albumin 3.0 (*)    GFR calc non Af Amer 59 (*)    GFR calc Af Amer 69 (*)    All other components within normal limits  URINALYSIS, ROUTINE W REFLEX MICROSCOPIC - Abnormal; Notable for the following:    Color, Urine AMBER (*) BIOCHEMICALS MAY BE AFFECTED BY COLOR   APPearance CLOUDY (*)    Ketones, ur TRACE (*)    Protein, ur 30 (*)    Leukocytes, UA MODERATE (*)    All other components within normal limits  URINE MICROSCOPIC-ADD ON - Abnormal; Notable for the following:    Squamous Epithelial / LPF MANY (*)    Bacteria, UA FEW (*)    All other components within normal limits  POCT I-STAT, CHEM 8 - Abnormal; Notable for the following:    Glucose, Bld 120 (*)    Calcium, Ion 1.06 (*)    Hemoglobin 11.9 (*)    HCT 35.0 (*)    All other components within normal limits  LIPASE, BLOOD  POCT I-STAT TROPONIN I   No results found.   1. Nausea and vomiting   2. UTI (lower urinary tract infection)       MDM  78yo nursing home pt w/ h/o dementia but otherwise healthy presents w/ c/o N/V this morning.  No CP/dyspnea, abd pain, diarrhea or fever.  Afebrile, well hydrated and abd benign/non-tender on exam.  EKG and troponin ordered to r/o anginal equivalent; all labs pending.     EKG non-ischemic and labs sig for hyponatremia and UTI.  Pt received 1L bolus of fluids and zofran.  She has no nausea currently and is tolerating pos fluids.  Sodium level improved.  Pt d/c'd home w/ keflex and zofran.  Return precautions discussed.        Arie Sabina [place], [place] 08/29/11 [phone]

## 2011-08-29 NOTE — ED Notes (Signed)
JYN:[id number]<AO> Expected date:<BR> Expected time: 1:28 PM<BR> Means of arrival:<BR> Comments:<BR> [id number] - 89yoF Vomiting

## 2011-08-30 NOTE — ED Provider Notes (Signed)
Medical screening examination/treatment/procedure(s) were performed by non-physician practitioner and as supervising physician I was immediately available for consultation/collaboration.   Leigh-Ann Jacaria Colburn, MD 08/30/11 1455 

## 2011-10-21 DIAGNOSIS — C9000 Multiple myeloma not having achieved remission: Secondary | ICD-10-CM

## 2011-10-21 HISTORY — DX: Multiple myeloma not having achieved remission: C90.00

## 2011-11-20 ENCOUNTER — Telehealth: Payer: Self-pay | Admitting: Oncology

## 2011-11-20 NOTE — Telephone Encounter (Signed)
pts son rtn call and scheduled pt for appt for 06/14. will refax a letter to Dr. Hennie Duos office with appt d/t

## 2011-11-20 NOTE — Telephone Encounter (Signed)
called pts to schedule appt and pt stated that she is unable to come to any appts right now because of transportation. will fax a letter to Dr. Renato Gails office with information

## 2011-11-27 ENCOUNTER — Encounter: Payer: Self-pay | Admitting: Oncology

## 2011-11-27 DIAGNOSIS — D72829 Elevated white blood cell count, unspecified: Secondary | ICD-10-CM | POA: Insufficient documentation

## 2011-12-03 ENCOUNTER — Telehealth: Payer: Self-pay | Admitting: Oncology

## 2011-12-03 NOTE — Telephone Encounter (Signed)
Chart delivered- [email or web address]

## 2011-12-04 ENCOUNTER — Ambulatory Visit: Payer: Medicare Other

## 2011-12-04 ENCOUNTER — Ambulatory Visit (HOSPITAL_BASED_OUTPATIENT_CLINIC_OR_DEPARTMENT_OTHER): Payer: Medicare Other | Admitting: Oncology

## 2011-12-04 ENCOUNTER — Encounter: Payer: Self-pay | Admitting: Oncology

## 2011-12-04 ENCOUNTER — Other Ambulatory Visit (HOSPITAL_BASED_OUTPATIENT_CLINIC_OR_DEPARTMENT_OTHER): Payer: Medicare Other | Admitting: Lab

## 2011-12-04 ENCOUNTER — Other Ambulatory Visit: Payer: Self-pay | Admitting: Oncology

## 2011-12-04 VITALS — BP 151/82 | HR 88 | Temp 96.9°F | Ht 64.0 in | Wt 115.6 lb

## 2011-12-04 DIAGNOSIS — D72829 Elevated white blood cell count, unspecified: Secondary | ICD-10-CM

## 2011-12-04 DIAGNOSIS — H9190 Unspecified hearing loss, unspecified ear: Secondary | ICD-10-CM

## 2011-12-04 DIAGNOSIS — M8448XA Pathological fracture, other site, initial encounter for fracture: Secondary | ICD-10-CM

## 2011-12-04 DIAGNOSIS — R5383 Other fatigue: Secondary | ICD-10-CM

## 2011-12-04 DIAGNOSIS — F0390 Unspecified dementia without behavioral disturbance: Secondary | ICD-10-CM

## 2011-12-04 DIAGNOSIS — C9000 Multiple myeloma not having achieved remission: Secondary | ICD-10-CM

## 2011-12-04 LAB — CBC WITH DIFFERENTIAL/PLATELET
Basophils Absolute: 0.1 10*3/uL (ref 0.0–0.1)
Eosinophils Absolute: 0.3 10*3/uL (ref 0.0–0.5)
HCT: 37.1 % (ref 34.8–46.6)
HGB: 12.5 g/dL (ref 11.6–15.9)
LYMPH%: 30.9 % (ref 14.0–49.7)
MCV: 92.4 fL (ref 79.5–101.0)
MONO%: 9.7 % (ref 0.0–14.0)
NEUT#: 4.1 10*3/uL (ref 1.5–6.5)
NEUT%: 55.2 % (ref 38.4–76.8)
Platelets: 334 10*3/uL (ref 145–400)

## 2011-12-04 LAB — COMPREHENSIVE METABOLIC PANEL
ALT: 9 U/L (ref 0–35)
CO2: 27 mEq/L (ref 19–32)
Chloride: 89 mEq/L — ABNORMAL LOW (ref 96–112)
Sodium: 125 mEq/L — ABNORMAL LOW (ref 135–145)
Total Bilirubin: 0.3 mg/dL (ref 0.3–1.2)
Total Protein: 9.1 g/dL — ABNORMAL HIGH (ref 6.0–8.3)

## 2011-12-04 LAB — MORPHOLOGY: PLT EST: ADEQUATE

## 2011-12-04 NOTE — Progress Notes (Signed)
[facility]  Telephone:(336) [phone] Fax:(336) [phone]     INITIAL HEMATOLOGY CONSULTATION    Referral MD:  Dr. Windle Guard, M.D.  Reason for Referral: newly diagnosed multiple myeloma.     HPI:  Jamie Bass is a 78 year-old woman with no significant past medical history.  She did not have routine medical check up.  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  Since late 2012, she has been developing progressive fatigue and lower back pain.  She was admitted to ED in Jan 2013 for failure to thrive and was found to have compression fracture, hyponatremia, renal insufficiency from poor PO intake.  She was set up to see Dr. Jeannetta Nap.  Routine blood work showed increased total protein.  Thus, Dr. Jeannetta Nap sent an SPEP on 11/17/2011 with M-spike of 2.4 gm/dL.  She was thus kindly referred for evaluation.    Jamie Bass presented to clinic for the 1st time today with her 2 sons and a daughter in Social worker.  She was very hard of hearing and has very poor short term memory; therefore, the history was provided by her family.  She still has fatigue, anorexia, weight loss (about 10-20 lb over last few months).  She is able to performs light activities of daily living.  However, she and her son with bipolar needs home health aids.  Her lower back pain is no longer a problem.   Patient denies headache, visual changes, confusion, drenching night sweats, palpable lymph node swelling, mucositis, odynophagia, dysphagia, nausea vomiting, jaundice, chest pain, palpitation, shortness of breath, dyspnea on exertion, productive cough, gum bleeding, epistaxis, hematemesis, hemoptysis, abdominal pain, abdominal swelling, early satiety, melena, hematochezia, hematuria, skin rash, spontaneous bleeding, joint swelling, joint pain, heat or cold intolerance, bowel bladder incontinence.    Past Medical History  Diagnosis Date  . Hiatal hernia   . Glaucoma   . Macular degeneration   .  GERD (gastroesophageal reflux disease)   . Leukocytosis   . Multiple myeloma 10/2011  . Fatigue   . Compression fracture of L1 lumbar vertebra 06/2011  :    Past Surgical History  Procedure Date  . No past surgeries   :   CURRENT MEDS: Current Outpatient Prescriptions  Medication Sig Dispense Refill  . COENZYME Q-10 PO Take by mouth daily.      . ergocalciferol (VITAMIN D2) 50000 UNITS capsule Take 50,000 Units by mouth once a week. Every 2 weeks      . memantine (NAMENDA) 5 MG tablet Take 5 mg by mouth daily.      . Multiple Vitamins-Minerals (OCUVITE PRESERVISION) TABS Take 1 tablet by mouth 2 (two) times daily.      . psyllium (METAMUCIL) 58.6 % powder Take 1 packet by mouth daily.      Marland Kitchen OVER THE COUNTER MEDICATION Take 1 capsule by mouth daily. Bacopa 66.5mg  for alertness.          No Known Allergies:  Family History  Problem Relation Age of Onset  . Tuberculosis Mother   . Suicidality Father   :  History   Social History  . Marital Status: Widowed    Spouse Name: N/A    Number of Children: 2  . Years of Education: N/A   Occupational History  . Not on file.   Social History Main Topics  . Smoking status: Never Smoker   . Smokeless tobacco: Never Used  . Alcohol Use: No  . Drug  Use: No  . Sexually Active: No   Other Topics Concern  . Not on file   Social History Narrative   Best contact is son Jamie Bass either at [phone] or [phone].   :  REVIEW OF SYSTEM:  The rest of the 14-point review of sytem was negative.   Exam: ECOG 2  General:  Thin, pleasant woman, in no acute distress.  Eyes:  no scleral icterus.  ENT:  There were no oropharyngeal lesions.  Neck was without thyromegaly.  Lymphatics:  Negative cervical, supraclavicular or axillary adenopathy.  Respiratory: lungs were clear bilaterally without wheezing or crackles.  Cardiovascular:  Regular rate and rhythm, S1/S2, without murmur, rub or gallop.  There was no pedal edema.  GI:   abdomen was soft, flat, nontender, nondistended, without organomegaly.  Muscoloskeletal:  no spinal tenderness of palpation of vertebral spine.  Skin exam was without echymosis, petichae.  Neuro exam was nonfocal.  Patient needed help to get on and off exam table.  Gait was weak.   LABS:  Lab Results  Component Value Date   WBC 7.5 12/04/2011   HGB 12.5 12/04/2011   HCT 37.1 12/04/2011   PLT 334 12/04/2011   GLUCOSE 105* 12/04/2011   ALT 9 12/04/2011   AST 22 12/04/2011   NA 125* 12/04/2011   K 3.7 12/04/2011   CL 89* 12/04/2011   CREATININE 0.80 12/04/2011   BUN 13 12/04/2011   CO2 27 12/04/2011    ASSESSMENT AND PLAN:   1.  Dementia:  Poor short term memory.  She is able to feed herself but needs help from home health aids.  2.  History of renal insufficiency:  Possibly due to myeloma vs dehydration in Jan 2013.  However, these have resolved for today. 3.  Deconditioning:  Most likely due to myeloma.  4.  Lumbar decompression fracture:  Could either be benign or pathologic from myeloma.   She is no longer symptomatic in the back.  However, I sent for skeletal Xray to document lytic lesions in case she develops pain later on which she would benefit from radiation.  5.  Newly diagnosed multiple myeloma: - Diagnosis:  Normally would require diagnostic bone marrow biopsy to document % of plasma cell; cytogenetics for risk assessment.  However, her relatives did not want to subject her to the procedure given her age and dementia.  - Work up:  I sent for IFIX, serum free light chain, 24 hour urine collection, skeletal survey to complete work up. - Indication for treatment:  Even though she does not have anemia, renal insufficiency, hypercalcemia today, she does have deconditioning and compression fracture which can be attributed to multiple myeloma.   - Treatment options:  She is not a candidate for aggressive chemotherapy including combination of Velcade/Revlimid/dexamethasone on bone marrow  transplant giving her advanced age and dementia.  I discussed with degenerative 2 options. The first one is symptom directed therapy which is pain control. The second option is a candidate chemotherapy with Revlimid 10 mg by mouth daily with 3 weeks on one week off every 4 weeks cycle without the use of Velcade or dexamethasone.  She has difficulty getting to the clinic and so therefore Velcade is difficult. Given her age and dementia, dexamethasone may cause worsening dementia and sundowning. I discussed with her relatives the potential side effects of Revlimid which include but not limited to cytopenia, mucositis, infection, bleeding, thrombosis, fatigue, skin rash, electrolyte abnormality, birth defects. The family  expressed informed understanding of the potential risk. However they would also like a chance to improve her performance status because to this year she was very strong. -To assess response to therapy to see her every month to ensure that she does have severe cytopenia from therapy in addition to follow but the M spike to ensure that is going down appropriately.    - A spent time him over the consent form from [employer] and her son who was her power of attorney signed the consent form.   - Followup: In about 5 weeks.  6.  Code status:  The patient's relative had already discussed CODE STATUS and they have decided to be DNR/DNI in case she has cardiopulmonary arrest.   Thank you for this referral.    The length of time of the face-to-face encounter was 80 minutes. More than 50% of time was spent counseling and coordination of care.

## 2011-12-04 NOTE — Patient Instructions (Signed)
A.  Issue:  Suspected multiple myeloma (a slow growing cancer of the bone marrow.  Untreated, it can cause anemia, kidney failure, bone fractures, high calcium). B.  Diagnosis:  Bone marrow biopsy; 24 hour urine collection. C.  Treatment:   - Mild chemotherapy Revlmid and steroid Dexamethasone.  Side effects of this regimen  include but not limited to alopecia, fatigue, heart burn, insomnia, mouth sore, low blood count, bleeding, infection, blood clot, skin rash, neuropathy, secondary cancer, electrolyte abnormality, constipation, diarrhea, birth defects.

## 2011-12-07 ENCOUNTER — Encounter: Payer: Self-pay | Admitting: Oncology

## 2011-12-08 LAB — KAPPA/LAMBDA LIGHT CHAINS
Kappa:Lambda Ratio: 0.07 — ABNORMAL LOW (ref 0.26–1.65)
Lambda Free Lght Chn: 56.6 mg/dL — ABNORMAL HIGH (ref 0.57–2.63)

## 2011-12-08 LAB — IMMUNOFIXATION ELECTROPHORESIS: IgM, Serum: 45 mg/dL — ABNORMAL LOW (ref 52–322)

## 2011-12-08 NOTE — Progress Notes (Addendum)
12/07/11 Completed paperwork for [employer] Patient Support.  Faxed it to [employer].  12/08/11 fax from [facility] saying a PA needed to be done.  I called [facility] and spoke to [place].  It has been approved until 06/08/12. [id number].  Revlimid Rx to [facility].

## 2011-12-09 ENCOUNTER — Other Ambulatory Visit: Payer: Self-pay | Admitting: *Deleted

## 2011-12-09 ENCOUNTER — Encounter: Payer: Self-pay | Admitting: *Deleted

## 2011-12-09 ENCOUNTER — Telehealth: Payer: Self-pay | Admitting: *Deleted

## 2011-12-09 MED ORDER — LENALIDOMIDE 10 MG PO CAPS: 10.0000 mg | ORAL_CAPSULE | Freq: Every day | ORAL | Status: DC

## 2011-12-09 NOTE — Telephone Encounter (Signed)
Revlimid 10 mg capsules has ben approved through 06/08/2012.  Will notify providers.

## 2011-12-09 NOTE — Telephone Encounter (Signed)
Received call from [facility] to contact [employer] regarding pt's Revlimid.  Called [employer] and confirmed pt has gone through natural menopause.  They said pt or representative needs to do pt survey.  Called pt's son and instructed on doing the pt survey for pt.  Gave him the [employer] phone number and asked him to call once they actually receive the Revlimid.  He verbalized understanding.

## 2011-12-10 ENCOUNTER — Telehealth: Payer: Self-pay | Admitting: Oncology

## 2011-12-10 NOTE — Telephone Encounter (Signed)
pt came in on 06/14 family stated that they did want to scheduled anything at this time but will rtn call to me once they are ready to schedule appts

## 2011-12-11 ENCOUNTER — Telehealth: Payer: Self-pay | Admitting: Oncology

## 2011-12-11 ENCOUNTER — Telehealth: Payer: Self-pay | Admitting: *Deleted

## 2011-12-11 ENCOUNTER — Encounter: Payer: Self-pay | Admitting: *Deleted

## 2011-12-11 NOTE — Telephone Encounter (Signed)
Call from pt's son states they received Revlimid today.  Instructed to start pt on Revlimid daily x 21 days, then one week off prior to next cycle.  Instructed to call for any problems/concerns, new symptoms.   Spoke w/ Clenton Pare, NP regarding pt already received/starting Revlimid and f/u visit will need to be sooner to get next refill ordered in time for next cycle.  She agreed and order sent to scheduling to move lab and office visit up several weeks, on pt's week off of Revlimid.

## 2011-12-11 NOTE — Telephone Encounter (Signed)
Called pts daughter @ [phone] lmomv for pts appt d/t in july2013

## 2011-12-11 NOTE — Progress Notes (Signed)
RECEIVED A FAX FROM [facility] CONCERNING A CONFIRMATION OF PRESCRIPTION SHIPMENT FOR REVLIMID. 

## 2011-12-14 ENCOUNTER — Telehealth: Payer: Self-pay | Admitting: Oncology

## 2011-12-14 NOTE — Telephone Encounter (Signed)
pts son called and r/s appt time on 07/19 to 2:30pm

## 2011-12-17 ENCOUNTER — Telehealth: Payer: Self-pay | Admitting: Oncology

## 2011-12-17 NOTE — Telephone Encounter (Signed)
called pts home and provided appt for bone survey for 07/12 @ [facility]

## 2011-12-31 ENCOUNTER — Other Ambulatory Visit: Payer: Self-pay | Admitting: *Deleted

## 2011-12-31 NOTE — Telephone Encounter (Signed)
THIS REFILL REQUEST WAS PLACED IN DR.HA'S ACTIVE WORK FOLDER. 

## 2012-01-01 ENCOUNTER — Telehealth: Payer: Self-pay | Admitting: *Deleted

## 2012-01-01 ENCOUNTER — Ambulatory Visit (HOSPITAL_COMMUNITY)
Admission: RE | Admit: 2012-01-01 | Discharge: 2012-01-01 | Disposition: A | Discharge status: Home / Self Care | Payer: Medicare Other | Source: Ambulatory Visit | Attending: Oncology | Admitting: Oncology

## 2012-01-01 DIAGNOSIS — C9000 Multiple myeloma not having achieved remission: Secondary | ICD-10-CM | POA: Insufficient documentation

## 2012-01-01 DIAGNOSIS — D72829 Elevated white blood cell count, unspecified: Secondary | ICD-10-CM

## 2012-01-01 DIAGNOSIS — M8448XA Pathological fracture, other site, initial encounter for fracture: Secondary | ICD-10-CM | POA: Insufficient documentation

## 2012-01-01 NOTE — Telephone Encounter (Signed)
[facility] faxed revlimid refill request.  Request to MD for review. 

## 2012-01-06 ENCOUNTER — Telehealth: Payer: Self-pay | Admitting: *Deleted

## 2012-01-06 NOTE — Telephone Encounter (Signed)
[facility] faxed refill request for revlimid.  Request to MD for review. 

## 2012-01-08 ENCOUNTER — Ambulatory Visit: Payer: Medicare Other | Admitting: Oncology

## 2012-01-08 ENCOUNTER — Other Ambulatory Visit: Payer: Medicare Other

## 2012-01-08 ENCOUNTER — Ambulatory Visit (HOSPITAL_BASED_OUTPATIENT_CLINIC_OR_DEPARTMENT_OTHER): Payer: Medicare Other | Admitting: Oncology

## 2012-01-08 ENCOUNTER — Telehealth: Payer: Self-pay | Admitting: Oncology

## 2012-01-08 ENCOUNTER — Encounter: Payer: Self-pay | Admitting: Oncology

## 2012-01-08 ENCOUNTER — Other Ambulatory Visit (HOSPITAL_BASED_OUTPATIENT_CLINIC_OR_DEPARTMENT_OTHER): Payer: Medicare Other | Admitting: Lab

## 2012-01-08 VITALS — BP 114/57 | HR 111 | Temp 97.1°F | Ht 64.0 in | Wt 112.6 lb

## 2012-01-08 DIAGNOSIS — F329 Major depressive disorder, single episode, unspecified: Secondary | ICD-10-CM

## 2012-01-08 DIAGNOSIS — C9000 Multiple myeloma not having achieved remission: Secondary | ICD-10-CM

## 2012-01-08 DIAGNOSIS — R630 Anorexia: Secondary | ICD-10-CM

## 2012-01-08 DIAGNOSIS — M8448XA Pathological fracture, other site, initial encounter for fracture: Secondary | ICD-10-CM

## 2012-01-08 DIAGNOSIS — R634 Abnormal weight loss: Secondary | ICD-10-CM

## 2012-01-08 DIAGNOSIS — D6481 Anemia due to antineoplastic chemotherapy: Secondary | ICD-10-CM

## 2012-01-08 DIAGNOSIS — D72829 Elevated white blood cell count, unspecified: Secondary | ICD-10-CM

## 2012-01-08 DIAGNOSIS — F32A Depression, unspecified: Secondary | ICD-10-CM | POA: Insufficient documentation

## 2012-01-08 HISTORY — DX: Anorexia: R63.0

## 2012-01-08 HISTORY — DX: Depression, unspecified: F32.A

## 2012-01-08 LAB — CBC WITH DIFFERENTIAL/PLATELET
Basophils Absolute: 0.1 10*3/uL (ref 0.0–0.1)
Eosinophils Absolute: 0.1 10*3/uL (ref 0.0–0.5)
HCT: 32.8 % — ABNORMAL LOW (ref 34.8–46.6)
HGB: 11.1 g/dL — ABNORMAL LOW (ref 11.6–15.9)
LYMPH%: 43.9 % (ref 14.0–49.7)
MCH: 31.5 pg (ref 25.1–34.0)
MCV: 93.2 fL (ref 79.5–101.0)
MONO%: 18.7 % — ABNORMAL HIGH (ref 0.0–14.0)
NEUT#: 1.8 10*3/uL (ref 1.5–6.5)
NEUT%: 32.7 % — ABNORMAL LOW (ref 38.4–76.8)
Platelets: 332 10*3/uL (ref 145–400)

## 2012-01-08 MED ORDER — MIRTAZAPINE 7.5 MG PO TABS: 7.5000 mg | ORAL_TABLET | Freq: Every day | ORAL | Status: DC

## 2012-01-08 NOTE — Progress Notes (Signed)
[facility]  Telephone:(336) [phone] Fax:(336) [phone]   OFFICE PROGRESS NOTE   Cc:  Kaleen Mask, MD  DIAGNOSIS:  IgG lambda multiple myeloma.   CURRENT THERAPY:  Started on Revlimid 10mg  PO daily d1-21 in June 2013.   INTERVAL HISTORY: Jamie Bass 78 y.o. female returns for regular follow up with her son and daughter in law.  They reported that she has been weaker than before.  She has decreased appetite and weight loss. She has been cranky with the care taker lately.  She denied focal bone pain especially in her back.    Patient and her family denies fever, headache, visual changes, drenching night sweats, palpable lymph node swelling, mucositis, odynophagia, dysphagia, nausea vomiting, jaundice, chest pain, palpitation, shortness of breath, dyspnea on exertion, productive cough, gum bleeding, epistaxis, hematemesis, hemoptysis, abdominal pain, abdominal swelling, early satiety, melena, hematochezia, hematuria, skin rash, spontaneous bleeding, joint swelling, joint pain, heat or cold intolerance, bowel bladder incontinence, back pain.    Past Medical History  Diagnosis Date  . Hiatal hernia   . Glaucoma   . Macular degeneration   . GERD (gastroesophageal reflux disease)   . Leukocytosis   . Multiple myeloma 10/2011  . Fatigue   . Compression fracture of L1 lumbar vertebra 06/2011  . Dementia   . Hearing loss   . Depression 01/08/2012  . Anorexia 01/08/2012  . Depression 01/08/2012  . Anorexia 01/08/2012    Past Surgical History  Procedure Date  . No past surgeries     Current Outpatient Prescriptions  Medication Sig Dispense Refill  . COENZYME Q-10 PO Take by mouth daily.      . ergocalciferol (VITAMIN D2) 50000 UNITS capsule Take 50,000 Units by mouth once a week. Every 2 weeks      . galantamine (RAZADYNE ER) 8 MG 24 hr capsule Take 8 mg by mouth. daily      . lenalidomide (REVLIMID) 10 MG capsule Take 1 capsule (10 mg total) by mouth daily.   21 capsule  0  . Multiple Vitamins-Minerals (OCUVITE PRESERVISION) TABS Take 1 tablet by mouth 2 (two) times daily.      Marland Kitchen OVER THE COUNTER MEDICATION Take 1 capsule by mouth daily. Bacopa 66.5mg  for alertness.      . psyllium (METAMUCIL) 58.6 % powder Take 1 packet by mouth daily.      . mirtazapine (REMERON) 7.5 MG tablet Take 1 tablet (7.5 mg total) by mouth at bedtime.  30 tablet  3    ALLERGIES:   has no known allergies.  REVIEW OF SYSTEMS:  The rest of the 14-point review of system was negative.   Filed Vitals:   01/08/12 1500  BP: 114/57  Pulse: 111  Temp: 97.1 F (36.2 C)   Wt Readings from Last 3 Encounters:  01/08/12 112 lb 9.6 oz (51.075 kg)  12/04/11 115 lb 9.6 oz (52.436 kg)  07/13/11 129 lb 13.6 oz (58.9 kg)   ECOG Performance status: 2  PHYSICAL EXAMINATION:   General: Thin, pleasant woman, in no acute distress. Eyes: no scleral icterus. ENT: There were no oropharyngeal lesions. Neck was without thyromegaly. Lymphatics: Negative cervical, supraclavicular or axillary adenopathy. Respiratory: lungs were clear bilaterally without wheezing or crackles. Cardiovascular: Regular rate and rhythm, S1/S2, without murmur, rub or gallop. There was no pedal edema. GI: abdomen was soft, flat, nontender, nondistended, without organomegaly. Muscoloskeletal: no spinal tenderness of palpation of vertebral spine. Skin exam was without echymosis, petichae. Neuro exam was nonfocal.  Patient needed some help to get on and off exam table. Gait was weak.    LABORATORY/RADIOLOGY DATA:  Lab Results  Component Value Date   WBC 5.4 01/08/2012   HGB 11.1* 01/08/2012   HCT 32.8* 01/08/2012   PLT 332 01/08/2012   GLUCOSE 105* 12/04/2011   ALKPHOS 71 12/04/2011   ALT 9 12/04/2011   AST 22 12/04/2011   NA 125* 12/04/2011   K 3.7 12/04/2011   CL 89* 12/04/2011   CREATININE 0.80 12/04/2011   BUN 13 12/04/2011   CO2 27 12/04/2011    Dg Bone Survey Met  01/01/2012  *RADIOLOGY REPORT*  Clinical Data:  Multiple myeloma  METASTATIC BONE SURVEY  Comparison: Lumbar x-rays 07/11/2011  Findings: Skull is negative.  No lytic lesions identified on the skeletal survey.  Compression fracture of the superior endplate of T12 has progressed from the prior x-ray.  Severe compression fracture of L1 has progressed from the  prior study and has associated sclerosis. This may be due to pathologic fracture.  No other fractures in the spine.  IMPRESSION: Compression fractures of T12 and L1 have progressed since 07/11/2011.  These may be pathologic fractures.  Further evaluation with Lumbar MRI without and with contrast is suggested.  No lytic lesions are identified.  Original Report Authenticated By: Camelia Phenes, M.D.    ASSESSMENT AND PLAN:   1. Dementia: slightly worsened on chemo.   2. History of renal insufficiency: Possibly due to myeloma.  3. Deconditioning: Most likely due to myeloma.  4. Lumbar decompression fracture: no evidence of bone lytic lesion on bone survey.  Compression fracture could be due to myeloma.  She declined bone pain or cord compression symptoms. No therapy for this is indicated.  5.  Anemia:  Grade 1:  From Revlimid.  There is no active bleeding.  There is no indication for transfusion.  6. Multiple myeloma:  IgG lambda - s/p one cycle of Revlimid 10mg  PO d1-21.  I started Revlimid at a low dose due to her age.  I did not recommend Prednisone or Dex due to her age and dementia.  She has grade 2 fatigue, deconditioning, anorexia, weight loss.  Her SPEP and light chain are pending to ascertain her response to one cycle of chemo.  Her son and daughter in law would like to continue for now to see if she has response.  - I recommended proceeding with 2nd cycle with further dose reduction now to Revlimid 5mg  PO daily d1-21.   - We will reassess her in one month.  If her symptoms worsen, I will recommend to stop chemo as to persevere her quality of life.  Her children inquired about prognosis  without and without treatment.  Without bone marrow biopsy and cytogenetics, it is not possible to give this prognosis.  However, if without treatment, and she develops renal failure, hypercalcemia, anemia, then, the expected survival would be around 6 months.    7.  Anorexia:  Either from myeloma or from treatment.  I prescribed Remeron 7.5mg  PO daily.   8. Code status:  DNR/DNI per our discussion on 12/04/2011.      The length of time of the face-to-face encounter was 25 minutes. More than 50% of time was spent counseling and coordination of care.

## 2012-01-08 NOTE — Telephone Encounter (Signed)
gv pt/relative appt schedule for August. °

## 2012-01-11 ENCOUNTER — Other Ambulatory Visit: Payer: Self-pay | Admitting: *Deleted

## 2012-01-11 MED ORDER — LENALIDOMIDE 5 MG PO CAPS: 5.0000 mg | ORAL_CAPSULE | Freq: Every day | ORAL | Status: DC

## 2012-01-11 NOTE — Telephone Encounter (Signed)
RECEIVED A FAX FROM [facility] CONCERNING A CONFIRMATION OF [facility] RECEIPT FOR PT.'S REFERRAL. 

## 2012-01-12 LAB — PROTEIN ELECTROPHORESIS, SERUM
Alpha-1-Globulin: 5 % — ABNORMAL HIGH (ref 2.9–4.9)
Alpha-2-Globulin: 11.4 % (ref 7.1–11.8)
Beta 2: 33.3 % — ABNORMAL HIGH (ref 3.2–6.5)
Gamma Globulin: 13.5 % (ref 11.1–18.8)
M-Spike, %: 2.46 g/dL

## 2012-01-12 LAB — COMPREHENSIVE METABOLIC PANEL
ALT: 33 U/L (ref 0–35)
AST: 35 U/L (ref 0–37)
Albumin: 2.6 g/dL — ABNORMAL LOW (ref 3.5–5.2)
Alkaline Phosphatase: 93 U/L (ref 39–117)
BUN: 13 mg/dL (ref 6–23)
Chloride: 101 mEq/L (ref 96–112)
Creatinine, Ser: 0.87 mg/dL (ref 0.50–1.10)
Potassium: 4.2 mEq/L (ref 3.5–5.3)

## 2012-01-13 ENCOUNTER — Encounter: Payer: Self-pay | Admitting: *Deleted

## 2012-01-13 ENCOUNTER — Telehealth: Payer: Self-pay | Admitting: *Deleted

## 2012-01-13 ENCOUNTER — Other Ambulatory Visit: Payer: Self-pay | Admitting: Oncology

## 2012-01-13 NOTE — Progress Notes (Signed)
Fax received from [facility] they shipped pt's Revlimid on 01/12/12 for delivery today.

## 2012-01-13 NOTE — Telephone Encounter (Signed)
Dau in law called to clarify meds,  Especially the remeron,  Wanted to know if pt should take in the morning or at night. Instructed to give at bedtime as it can make pt drowsy.   She also reports received Revlimid and will start it tonight as well.  Confirmed new dose is 5 mg.  Instructed her to call back if any further questions/concerns.  She verbalized understanding.

## 2012-01-14 ENCOUNTER — Encounter: Payer: Self-pay | Admitting: Dietician

## 2012-01-14 ENCOUNTER — Telehealth: Payer: Self-pay | Admitting: *Deleted

## 2012-01-14 NOTE — Progress Notes (Signed)
Brief Out-patient Oncology Nutrition Note  Reason: Patient Screened Positive For Nutrition Risk For Unintentional Weight Loss and Decreased Appetite.  Jamie Bass is a 78 year old female patient of Dr. Gaylyn Rong, diagnosed with multiple myeloma.Contacted patient via telephone for positive nutrition risk. Patient was unable to hear RD. Spoke with patient's family member. He reported patient's appetite has not been good. He stated she eats small amounts at meals. He reported she drinks 1 Boost nutrition supplement daily. He denies patient having any problems with nausea or changes in taste.   Wt Readings from Last 10 Encounters:  01/08/12 112 lb 9.6 oz (51.075 kg)  12/04/11 115 lb 9.6 oz (52.436 kg)  07/13/11 129 lb 13.6 oz (58.9 kg)    I have educated the patient's husband  on high calorie, high protein diet. We discussed strategies to increase caloric intake. The patient was without any further nutrition related questions.  RD available for nutrition needs.   Jamie Finn, MS, RD, LDN [phone]

## 2012-01-14 NOTE — Telephone Encounter (Signed)
Son called,  States returning Dr. Lodema Pilot call regarding "protein test results."   He can be reached on cell #[phone].

## 2012-02-05 ENCOUNTER — Other Ambulatory Visit (HOSPITAL_BASED_OUTPATIENT_CLINIC_OR_DEPARTMENT_OTHER): Payer: Medicare Other | Admitting: Lab

## 2012-02-05 ENCOUNTER — Encounter: Payer: Self-pay | Admitting: Oncology

## 2012-02-05 ENCOUNTER — Ambulatory Visit (HOSPITAL_BASED_OUTPATIENT_CLINIC_OR_DEPARTMENT_OTHER): Payer: Medicare Other | Admitting: Oncology

## 2012-02-05 ENCOUNTER — Telehealth: Payer: Self-pay | Admitting: Oncology

## 2012-02-05 ENCOUNTER — Other Ambulatory Visit: Payer: Self-pay | Admitting: *Deleted

## 2012-02-05 VITALS — BP 130/75 | HR 102 | Temp 96.8°F | Resp 20 | Ht 64.0 in | Wt 112.5 lb

## 2012-02-05 DIAGNOSIS — C9000 Multiple myeloma not having achieved remission: Secondary | ICD-10-CM

## 2012-02-05 DIAGNOSIS — R630 Anorexia: Secondary | ICD-10-CM

## 2012-02-05 DIAGNOSIS — F3289 Other specified depressive episodes: Secondary | ICD-10-CM

## 2012-02-05 DIAGNOSIS — F0280 Dementia in other diseases classified elsewhere without behavioral disturbance: Secondary | ICD-10-CM

## 2012-02-05 DIAGNOSIS — F329 Major depressive disorder, single episode, unspecified: Secondary | ICD-10-CM

## 2012-02-05 DIAGNOSIS — D6481 Anemia due to antineoplastic chemotherapy: Secondary | ICD-10-CM

## 2012-02-05 LAB — COMPREHENSIVE METABOLIC PANEL
ALT: 21 U/L (ref 0–35)
AST: 26 U/L (ref 0–37)
Creatinine, Ser: 0.99 mg/dL (ref 0.50–1.10)
Total Bilirubin: 0.3 mg/dL (ref 0.3–1.2)

## 2012-02-05 LAB — CBC WITH DIFFERENTIAL/PLATELET
BASO%: 0.5 % (ref 0.0–2.0)
EOS%: 6.4 % (ref 0.0–7.0)
HCT: 34.2 % — ABNORMAL LOW (ref 34.8–46.6)
LYMPH%: 49.7 % (ref 14.0–49.7)
MCH: 31.6 pg (ref 25.1–34.0)
MCHC: 33.7 g/dL (ref 31.5–36.0)
NEUT%: 32.4 % — ABNORMAL LOW (ref 38.4–76.8)
Platelets: 256 10*3/uL (ref 145–400)

## 2012-02-05 NOTE — Telephone Encounter (Signed)
Gave pt appt for September 2013 lab and MD 

## 2012-02-05 NOTE — Telephone Encounter (Signed)
Request received from [facility] for Revlimid refill.  Given to [employer] PA who sees patient today.

## 2012-02-05 NOTE — Progress Notes (Signed)
[facility]  Telephone:([phone] Fax:(336) [phone]   OFFICE PROGRESS NOTE   Cc:  Jamie Mask, MD  DIAGNOSIS:  IgG lambda multiple myeloma.   CURRENT THERAPY:  Started on Revlimid 10mg  PO daily d1-21 in June 2013.   INTERVAL HISTORY: Jamie Bass 78 y.o. female returns for regular follow up with her son and daughter in law. She has been fatigued over the past month, but has been able to engage in more activities. Appetite is better and weight is stable. She denied focal bone pain especially in her back.    Patient and her family denies fever, headache, visual changes, drenching night sweats, palpable lymph node swelling, mucositis, odynophagia, dysphagia, nausea vomiting, jaundice, chest pain, palpitation, shortness of breath, dyspnea on exertion, productive cough, gum bleeding, epistaxis, hematemesis, hemoptysis, abdominal pain, abdominal swelling, early satiety, melena, hematochezia, hematuria, skin rash, spontaneous bleeding, joint swelling, joint pain, heat or cold intolerance, bowel bladder incontinence, back pain.    Past Medical History  Diagnosis Date  . Hiatal hernia   . Glaucoma   . Macular degeneration   . GERD (gastroesophageal reflux disease)   . Leukocytosis   . Multiple myeloma 10/2011  . Fatigue   . Compression fracture of L1 lumbar vertebra 06/2011  . Dementia   . Hearing loss   . Depression 01/08/2012  . Anorexia 01/08/2012  . Depression 01/08/2012  . Anorexia 01/08/2012    Past Surgical History  Procedure Date  . No past surgeries     Current Outpatient Prescriptions  Medication Sig Dispense Refill  . COENZYME Q-10 PO Take by mouth daily.      . ergocalciferol (VITAMIN D2) 50000 UNITS capsule Take 50,000 Units by mouth once a week. Every 2 weeks      . galantamine (RAZADYNE ER) 8 MG 24 hr capsule Take 8 mg by mouth. daily      . lenalidomide (REVLIMID) 5 MG capsule Take 1 capsule (5 mg total) by mouth daily.  21 capsule  0    . mirtazapine (REMERON) 7.5 MG tablet Take 1 tablet (7.5 mg total) by mouth at bedtime.  30 tablet  3  . Multiple Vitamins-Minerals (OCUVITE PRESERVISION) TABS Take 1 tablet by mouth 2 (two) times daily.      Marland Kitchen OVER THE COUNTER MEDICATION Take 1 capsule by mouth daily. Bacopa 66.5mg  for alertness.      . psyllium (METAMUCIL) 58.6 % powder Take 1 packet by mouth daily.        ALLERGIES:   has no known allergies.  REVIEW OF SYSTEMS:  The rest of the 14-point review of system was negative.   Filed Vitals:   02/05/12 1507  BP: 130/75  Pulse: 102  Temp: 96.8 F (36 C)  Resp: 20   Wt Readings from Last 3 Encounters:  02/05/12 112 lb 8 oz (51.03 kg)  01/08/12 112 lb 9.6 oz (51.075 kg)  12/04/11 115 lb 9.6 oz (52.436 kg)   ECOG Performance status: 2  PHYSICAL EXAMINATION:   General: Thin, pleasant woman, in no acute distress. Eyes: no scleral icterus. ENT: There were no oropharyngeal lesions. Neck was without thyromegaly. Lymphatics: Negative cervical, supraclavicular or axillary adenopathy. Respiratory: lungs were clear bilaterally without wheezing or crackles. Cardiovascular: Regular rate and rhythm, S1/S2, without murmur, rub or gallop. There was no pedal edema. GI: abdomen was soft, flat, nontender, nondistended, without organomegaly. Muscoloskeletal: no spinal tenderness of palpation of vertebral spine. Skin exam was without echymosis, petichae. Neuro exam was nonfocal.  Patient needed some help to get on and off exam table. Gait was weak. Ambulates with walker.   LABORATORY/RADIOLOGY DATA:  Lab Results  Component Value Date   WBC 6.1 02/05/2012   HGB 11.5* 02/05/2012   HCT 34.2* 02/05/2012   PLT 256 02/05/2012   GLUCOSE 121* 02/05/2012   ALKPHOS 116 02/05/2012   ALT 21 02/05/2012   AST 26 02/05/2012   NA 132* 02/05/2012   K 3.7 02/05/2012   CL 96 02/05/2012   CREATININE 0.99 02/05/2012   BUN 17 02/05/2012   CO2 29 02/05/2012    ASSESSMENT AND PLAN:   1. Dementia: slightly  worsened on chemo.   2. History of renal insufficiency: Possibly due to myeloma.  3. Deconditioning: Most likely due to myeloma.  4. Lumbar decompression fracture: no evidence of bone lytic lesion on bone survey.  Compression fracture could be due to myeloma.  She declined bone pain or cord compression symptoms. No therapy for this is indicated.  5.  Anemia:  Grade 1:  From Revlimid.  There is no active bleeding.  There is no indication for transfusion.  6. Multiple myeloma:  IgG lambda - s/p two cycles of Revlimid 10mg  PO d1-21.  I started Revlimid at a low dose due to her age.  I did not recommend Prednisone or Dex due to her age and dementia.  She had grade 2 fatigue, deconditioning, anorexia, weight loss with the first cycle of chemotherapy.  Her SPEP and light chain are pending to ascertain her response to chemo.  Her son and daughter in law would like to continue for now to see if she has response. Dose was reduced to 5 mg daily with the second cycle with improvement in her fatigue and stabilization of her weight. Recommend that she continue Revlimid at 5 mg daily.  - We will reassess her in one month.  If her symptoms worsen, I will recommend to stop chemo as to persevere her quality of life.  Her children inquired about prognosis without and without treatment.  Without bone marrow biopsy and cytogenetics, it is not possible to give this prognosis.  However, if without treatment, and she develops renal failure, hypercalcemia, anemia, then, the expected survival would be around 6 months.    7.  Anorexia: On Remeron 7.5mg  PO daily with improved appetite and stabilization of her weight.  8. Code status:  DNR/DNI per our discussion on 12/04/2011.      The length of time of the face-to-face encounter was 25 minutes. More than 50% of time was spent counseling and coordination of care.

## 2012-02-09 LAB — PROTEIN ELECTROPHORESIS, SERUM
Alpha-2-Globulin: 9.7 % (ref 7.1–11.8)
Beta Globulin: 4.5 % — ABNORMAL LOW (ref 4.7–7.2)
Gamma Globulin: 16.1 % (ref 11.1–18.8)
M-Spike, %: 2.09 g/dL
Total Protein, Serum Electrophoresis: 7.7 g/dL (ref 6.0–8.3)

## 2012-02-09 LAB — KAPPA/LAMBDA LIGHT CHAINS: Lambda Free Lght Chn: 82.8 mg/dL — ABNORMAL HIGH (ref 0.57–2.63)

## 2012-02-10 ENCOUNTER — Encounter: Payer: Self-pay | Admitting: *Deleted

## 2012-02-10 NOTE — Progress Notes (Signed)
Fax rec'd from [facility] they shipped pt's Revlimid on 8/20 for delivery on next business day.

## 2012-02-17 ENCOUNTER — Telehealth: Payer: Self-pay | Admitting: *Deleted

## 2012-02-17 NOTE — Telephone Encounter (Signed)
Son left VM asking about results of labs from 8/16 and wanting to know if Revlimid is helping?

## 2012-02-29 ENCOUNTER — Telehealth: Payer: Self-pay | Admitting: *Deleted

## 2012-02-29 NOTE — Telephone Encounter (Signed)
Call from pt's daughter in law, Clydie Braun, states pt having some vaginal or rectal bleeding over the weekend. Caregivers report some blood on incontinence pads, no active bleeding and unable to determine source of bleeding.  Clydie Braun says pt had a little blood this morning on pad.  They were instructed by on call MD to hold Revlimid and call Dr. Gaylyn Rong on Monday.  Per Dr. Gaylyn Rong,  Pt to get CBC today and continue to hold Revlimid until results reviewed.  Clydie Braun verbalized understanding and arranged for pt to have labs at Dr. Jeannetta Nap office in am.  Faxed Rx for CBC to Dr. Jeannetta Nap at fax [phone].

## 2012-03-03 ENCOUNTER — Telehealth: Payer: Self-pay | Admitting: *Deleted

## 2012-03-03 NOTE — Telephone Encounter (Signed)
Rec'd CBC results from Dr. Jeannetta Nap and gave to Dr. Gaylyn Rong for review.  He instructed for pt to resume Revlimid today.  Called Clydie Braun and instructed to resume Revlimid tonight (pt takes in evenings).  Clydie Braun said today would have been last day of cycle and should pt stop after today or take the ones she missed this week?   Instructed to take tonight and clarify w/ Dr. Gaylyn Rong tomorrow on office visit if pt to take the ones she missed.  Also asked if pt having any further reports of bleeding and Clydie Braun says no further bleeding.

## 2012-03-04 ENCOUNTER — Telehealth: Payer: Self-pay | Admitting: Oncology

## 2012-03-04 ENCOUNTER — Other Ambulatory Visit (HOSPITAL_BASED_OUTPATIENT_CLINIC_OR_DEPARTMENT_OTHER): Payer: Medicare Other | Admitting: Lab

## 2012-03-04 ENCOUNTER — Ambulatory Visit (HOSPITAL_BASED_OUTPATIENT_CLINIC_OR_DEPARTMENT_OTHER): Payer: Medicare Other | Admitting: Oncology

## 2012-03-04 VITALS — BP 134/75 | HR 82 | Temp 96.8°F | Resp 16 | Ht 64.0 in | Wt 115.6 lb

## 2012-03-04 DIAGNOSIS — D649 Anemia, unspecified: Secondary | ICD-10-CM

## 2012-03-04 DIAGNOSIS — F0390 Unspecified dementia without behavioral disturbance: Secondary | ICD-10-CM

## 2012-03-04 DIAGNOSIS — C9000 Multiple myeloma not having achieved remission: Secondary | ICD-10-CM

## 2012-03-04 DIAGNOSIS — N289 Disorder of kidney and ureter, unspecified: Secondary | ICD-10-CM

## 2012-03-04 LAB — COMPREHENSIVE METABOLIC PANEL (CC13)
ALT: 21 U/L (ref 0–55)
Albumin: 2.6 g/dL — ABNORMAL LOW (ref 3.5–5.0)
Alkaline Phosphatase: 100 U/L (ref 40–150)
CO2: 25 mEq/L (ref 22–29)
Glucose: 97 mg/dl (ref 70–99)
Potassium: 4.2 mEq/L (ref 3.5–5.1)
Sodium: 131 mEq/L — ABNORMAL LOW (ref 136–145)
Total Bilirubin: 0.3 mg/dL (ref 0.20–1.20)
Total Protein: 8.5 g/dL — ABNORMAL HIGH (ref 6.4–8.3)

## 2012-03-04 LAB — CBC WITH DIFFERENTIAL/PLATELET
Basophils Absolute: 0.1 10*3/uL (ref 0.0–0.1)
Eosinophils Absolute: 0.7 10*3/uL — ABNORMAL HIGH (ref 0.0–0.5)
HGB: 11.4 g/dL — ABNORMAL LOW (ref 11.6–15.9)
MCV: 96.1 fL (ref 79.5–101.0)
MONO#: 1.1 10*3/uL — ABNORMAL HIGH (ref 0.1–0.9)
MONO%: 13.9 % (ref 0.0–14.0)
NEUT#: 2 10*3/uL (ref 1.5–6.5)
RDW: 16.9 % — ABNORMAL HIGH (ref 11.2–14.5)
WBC: 7.8 10*3/uL (ref 3.9–10.3)

## 2012-03-04 NOTE — Telephone Encounter (Signed)
gve the pt's  Son the oct 2013 appt calendar

## 2012-03-04 NOTE — Patient Instructions (Addendum)
A.  Multiple myeloma:  Response to Revlimid 5mg  by mouth d1-21 of each 4 week-cycle.  However, response is very slow.  B.  Consider increasing dose up to 10mg ?  C.  Follow up:  Lab and return visit in 5 weeks.

## 2012-03-05 NOTE — Progress Notes (Signed)
Jamie Bass  Telephone:(336) [phone] Fax:(336) [phone]   OFFICE PROGRESS NOTE   DIAGNOSIS: IgG lambda multiple myeloma.   CURRENT THERAPY: Started on Revlimid 10mg  PO daily d1-21 in June 2013.   INTERVAL HISTORY: Jamie Bass 78 y.o. female returns for regular follow up with her son and daughter in law.  Late last week, she had one episode of blood per rectum which spontaneously resolved.  Her CBC with her PCP did not show severe anemia or thrombocytopenia. She resumed her Revlimid when her CBC came back showing no thormbocytopenia.  She has mild fatigue; however, not as bad when she first started this medication 2-3 months ago.  She has chronic arthritic pain; however, not much different compared to past.  Patient denies fever, anorexia, weight loss, headache, visual changes, confusion, drenching night sweats, palpable lymph node swelling, mucositis, odynophagia, dysphagia, nausea vomiting, jaundice, chest pain, palpitation, shortness of breath, dyspnea on exertion, productive cough, gum bleeding, epistaxis, hematemesis, hemoptysis, abdominal pain, abdominal swelling, early satiety, melena, hematuria, skin rash, spontaneous bleeding, joint swelling, heat or cold intolerance, bowel bladder incontinence, focal motor weakness, paresthesia, depression, suicidal or homicidal ideation, feeling hopelessness.   Past Medical History  Diagnosis Date  . Hiatal hernia   . Glaucoma   . Macular degeneration   . GERD (gastroesophageal reflux disease)   . Leukocytosis   . Multiple myeloma 10/2011  . Fatigue   . Compression fracture of L1 lumbar vertebra 06/2011  . Dementia   . Hearing loss   . Depression 01/08/2012  . Anorexia 01/08/2012  . Depression 01/08/2012  . Anorexia 01/08/2012    Past Surgical History  Procedure Date  . No past surgeries     Current Outpatient Prescriptions  Medication Sig Dispense Refill  . COENZYME Q-10 PO Take by mouth daily.      .  ergocalciferol (VITAMIN D2) 50000 UNITS capsule Take 50,000 Units by mouth once a week. Every 2 weeks      . galantamine (RAZADYNE ER) 8 MG 24 hr capsule Take 8 mg by mouth. daily      . lenalidomide (REVLIMID) 5 MG capsule Take 1 capsule (5 mg total) by mouth daily.  21 capsule  0  . mirtazapine (REMERON) 7.5 MG tablet Take 7.5 mg by mouth at bedtime.      . Multiple Vitamins-Minerals (OCUVITE PRESERVISION) TABS Take 1 tablet by mouth 2 (two) times daily.      Marland Kitchen OVER THE COUNTER MEDICATION Take 1 capsule by mouth daily. Bacopa 66.5mg  for alertness.      . psyllium (METAMUCIL) 58.6 % powder Take 1 packet by mouth daily.        ALLERGIES:   has no known allergies.  REVIEW OF SYSTEMS:  The rest of the 14-point review of system was negative.   Filed Vitals:   03/04/12 1459  BP: 134/75  Pulse: 82  Temp: 96.8 F (36 C)  Resp: 16   Wt Readings from Last 3 Encounters:  03/04/12 115 lb 9.6 oz (52.436 kg)  02/05/12 112 lb 8 oz (51.03 kg)  01/08/12 112 lb 9.6 oz (51.075 kg)   ECOG Performance status: 1  PHYSICAL EXAMINATION:   General: Thin, pleasant woman, in no acute distress. Eyes: no scleral icterus. ENT: There were no oropharyngeal lesions. Neck was without thyromegaly. Lymphatics: Negative cervical, supraclavicular or axillary adenopathy. Respiratory: lungs were clear bilaterally without wheezing or crackles. Cardiovascular: Regular rate and rhythm, S1/S2, without murmur, rub or gallop. There was no pedal  edema. GI: abdomen was soft, flat, nontender, nondistended, without organomegaly. Muscoloskeletal: no spinal tenderness of palpation of vertebral spine. Skin exam was without echymosis, petichae. Neuro exam was nonfocal. Patient needed some help to get on and off exam table. Gait was weak.       LABORATORY/RADIOLOGY DATA:  Lab Results  Component Value Date   WBC 7.8 03/04/2012   HGB 11.4* 03/04/2012   HCT 34.0* 03/04/2012   PLT 241 03/04/2012   GLUCOSE 97 03/04/2012   ALKPHOS  100 03/04/2012   ALT 21 03/04/2012   AST 32 03/04/2012   NA 131* 03/04/2012   K 4.2 03/04/2012   CL 98 03/04/2012   CREATININE 1.0 03/04/2012   BUN 14.0 03/04/2012   CO2 25 03/04/2012        ASSESSMENT AND PLAN:      1. Dementia: slightly worsened on chemo.  Now stable after 3 months.  2. History of renal insufficiency: Possibly due to myeloma. Now stable Cr.  3. Deconditioning: This has leveled off. She is back to her baseline compared to prior to chemo.  4. Lumbar decompression fracture: no evidence of bone lytic lesion on bone survey. Compression fracture could be due to myeloma. She declined bone pain or cord compression symptoms. No therapy for this is indicated.  5. Anemia: Grade 1: From Revlimid. There is no active bleeding. There is no indication for transfusion.  6. Multiple myeloma: IgG lambda  S/p 3 months with mild decrease in Mspike from 2.6 down to 2.0.  She had grade 1 fatigue; anorexia which have now improved.  I recommended increasing Revlimid dose back to 10mg  PO daily d1-21.  In the future, if she does well, I may consider adding on low dose Dexamethasone 10mg  PO once weekly to improve response.   7. Anorexia: Either from myeloma or from treatment. She is on Remeron 7.5mg  PO daily.  This is working for her.   8. Code status: DNR/DNI per our discussion on 12/04/2011.   9.  Follow up:  In about 4 weeks.      The length of time of the face-to-face encounter was 25 minutes. More than 50% of time was spent counseling and coordination of care.

## 2012-03-07 ENCOUNTER — Other Ambulatory Visit: Payer: Self-pay | Admitting: *Deleted

## 2012-03-07 LAB — KAPPA/LAMBDA LIGHT CHAINS: Kappa:Lambda Ratio: 0.17 — ABNORMAL LOW (ref 0.26–1.65)

## 2012-03-07 MED ORDER — LENALIDOMIDE 10 MG PO CAPS: 10.0000 mg | ORAL_CAPSULE | Freq: Every day | ORAL | Status: DC

## 2012-03-07 NOTE — Telephone Encounter (Signed)
THIS REFILL REQUEST FOR REVLIMID WAS PLACED IN DR.HA'S ACTIVE WORK BIN. 

## 2012-03-08 LAB — PROTEIN ELECTROPHORESIS, SERUM
Albumin ELP: 35.3 % — ABNORMAL LOW (ref 55.8–66.1)
Alpha-1-Globulin: 3.7 % (ref 2.9–4.9)
Alpha-2-Globulin: 8.9 % (ref 7.1–11.8)
Beta 2: 31.1 % — ABNORMAL HIGH (ref 3.2–6.5)
Beta Globulin: 4.4 % — ABNORMAL LOW (ref 4.7–7.2)

## 2012-03-10 ENCOUNTER — Encounter: Payer: Self-pay | Admitting: *Deleted

## 2012-03-10 NOTE — Progress Notes (Signed)
Fax received from [facility] they shipped pt's Revlimid on 9/18 for delivery on today.

## 2012-04-05 ENCOUNTER — Other Ambulatory Visit: Payer: Self-pay | Admitting: *Deleted

## 2012-04-05 DIAGNOSIS — C9000 Multiple myeloma not having achieved remission: Secondary | ICD-10-CM

## 2012-04-05 NOTE — Telephone Encounter (Signed)
THIS REFILL REQUEST FOR REVLIMID WAS PLACED IN DR.HA'S ACTIVE WORK BIN. 

## 2012-04-07 MED ORDER — LENALIDOMIDE 10 MG PO CAPS: 10.0000 mg | ORAL_CAPSULE | Freq: Every day | ORAL | Status: DC

## 2012-04-07 NOTE — Addendum Note (Signed)
Addended by: Arvilla Meres on: 04/07/2012 10:04 AM   Modules accepted: Orders

## 2012-04-07 NOTE — Telephone Encounter (Signed)
RECEIVED A FAX FROM [facility] CONCERNING A CONFIRMATION OF [facility] RECEIPT FOR PT.'S REFERRAL. 

## 2012-04-08 ENCOUNTER — Telehealth: Payer: Self-pay | Admitting: Oncology

## 2012-04-08 ENCOUNTER — Ambulatory Visit (HOSPITAL_BASED_OUTPATIENT_CLINIC_OR_DEPARTMENT_OTHER): Payer: Medicare Other | Admitting: Oncology

## 2012-04-08 ENCOUNTER — Other Ambulatory Visit (HOSPITAL_BASED_OUTPATIENT_CLINIC_OR_DEPARTMENT_OTHER): Payer: Medicare Other | Admitting: Lab

## 2012-04-08 ENCOUNTER — Other Ambulatory Visit: Payer: Self-pay

## 2012-04-08 VITALS — BP 118/65 | HR 82 | Temp 96.7°F | Resp 20 | Ht 64.0 in | Wt 116.8 lb

## 2012-04-08 DIAGNOSIS — C9000 Multiple myeloma not having achieved remission: Secondary | ICD-10-CM

## 2012-04-08 DIAGNOSIS — T451X5A Adverse effect of antineoplastic and immunosuppressive drugs, initial encounter: Secondary | ICD-10-CM

## 2012-04-08 LAB — CBC WITH DIFFERENTIAL/PLATELET
BASO%: 0.6 % (ref 0.0–2.0)
LYMPH%: 56 % — ABNORMAL HIGH (ref 14.0–49.7)
MCHC: 33.2 g/dL (ref 31.5–36.0)
MCV: 97.2 fL (ref 79.5–101.0)
MONO%: 11.3 % (ref 0.0–14.0)
Platelets: 174 10*3/uL (ref 145–400)
RBC: 3.53 10*6/uL — ABNORMAL LOW (ref 3.70–5.45)
RDW: 16.9 % — ABNORMAL HIGH (ref 11.2–14.5)
WBC: 6.5 10*3/uL (ref 3.9–10.3)

## 2012-04-08 LAB — COMPREHENSIVE METABOLIC PANEL (CC13)
AST: 29 U/L (ref 5–34)
Albumin: 2.7 g/dL — ABNORMAL LOW (ref 3.5–5.0)
Alkaline Phosphatase: 96 U/L (ref 40–150)
BUN: 15 mg/dL (ref 7.0–26.0)
Creatinine: 1.2 mg/dL — ABNORMAL HIGH (ref 0.6–1.1)
Glucose: 136 mg/dl — ABNORMAL HIGH (ref 70–99)
Potassium: 3.5 mEq/L (ref 3.5–5.1)

## 2012-04-08 LAB — TECHNOLOGIST REVIEW

## 2012-04-08 MED ORDER — MIRTAZAPINE 7.5 MG PO TABS: 7.5000 mg | ORAL_TABLET | Freq: Every day | ORAL | Status: AC

## 2012-04-08 MED ORDER — LENALIDOMIDE 5 MG PO CAPS: 5.0000 mg | ORAL_CAPSULE | Freq: Every day | ORAL | Status: AC

## 2012-04-08 NOTE — Telephone Encounter (Signed)
appts made and printed for pt aom °

## 2012-04-08 NOTE — Telephone Encounter (Signed)
Gave pt appt calendar for November 2013 lab and MD °

## 2012-04-10 ENCOUNTER — Encounter: Payer: Self-pay | Admitting: Oncology

## 2012-04-10 NOTE — Progress Notes (Signed)
[facility]  Telephone:(336) [phone] Fax:(336) [phone]   OFFICE PROGRESS NOTE   DIAGNOSIS: IgG lambda multiple myeloma.   CURRENT THERAPY: Started on Revlimid 10mg  PO daily d1-21 in June 2013.   INTERVAL HISTORY: Jamie Bass 78 y.o. female returns for regular follow up with her son. Her Revlimid was increased to 10 mg at her last visit. Son states she has had more fatigue and overall weakness. He is concerned about her quality of life and would like to reduce the Revllimid dose back to 5 mg.  She has chronic arthritic pain; however, not much different compared to past. No bleeding.  Patient denies fever, anorexia, weight loss, headache, visual changes, confusion, drenching night sweats, palpable lymph node swelling, mucositis, odynophagia, dysphagia, nausea vomiting, jaundice, chest pain, palpitation, shortness of breath, dyspnea on exertion, productive cough, gum bleeding, epistaxis, hematemesis, hemoptysis, abdominal pain, abdominal swelling, early satiety, melena, hematuria, skin rash, spontaneous bleeding, joint swelling, heat or cold intolerance, bowel bladder incontinence, focal motor weakness, paresthesia, depression, suicidal or homicidal ideation, feeling hopelessness.   Past Medical History  Diagnosis Date  . Hiatal hernia   . Glaucoma(365)   . Macular degeneration   . GERD (gastroesophageal reflux disease)   . Leukocytosis   . Multiple myeloma 10/2011  . Fatigue   . Compression fracture of L1 lumbar vertebra 06/2011  . Dementia   . Hearing loss   . Depression 01/08/2012  . Anorexia 01/08/2012  . Depression 01/08/2012  . Anorexia 01/08/2012    Past Surgical History  Procedure Date  . No past surgeries     Current Outpatient Prescriptions  Medication Sig Dispense Refill  . COENZYME Q-10 PO Take by mouth daily.      . ergocalciferol (VITAMIN D2) 50000 UNITS capsule Take 50,000 Units by mouth once a week. Every 2 weeks      . galantamine (RAZADYNE  ER) 8 MG 24 hr capsule Take 8 mg by mouth. daily      . lenalidomide (REVLIMID) 5 MG capsule Take 1 capsule (5 mg total) by mouth daily.  21 capsule  0  . mirtazapine (REMERON) 7.5 MG tablet Take 1 tablet (7.5 mg total) by mouth at bedtime.  15 tablet  5  . Multiple Vitamins-Minerals (OCUVITE PRESERVISION) TABS Take 1 tablet by mouth 2 (two) times daily.      Marland Kitchen OVER THE COUNTER MEDICATION Take 1 capsule by mouth daily. Bacopa 66.5mg  for alertness.      . psyllium (METAMUCIL) 58.6 % powder Take 1 packet by mouth daily.        ALLERGIES:   has no known allergies.  REVIEW OF SYSTEMS:  The rest of the 14-point review of system was negative.   Filed Vitals:   04/08/12 1405  BP: 118/65  Pulse: 82  Temp: 96.7 F (35.9 C)  Resp: 20   Wt Readings from Last 3 Encounters:  04/08/12 116 lb 12.8 oz (52.98 kg)  03/04/12 115 lb 9.6 oz (52.436 kg)  02/05/12 112 lb 8 oz (51.03 kg)   ECOG Performance status: 1  PHYSICAL EXAMINATION:   General: Thin, pleasant woman, in no acute distress. Eyes: no scleral icterus. ENT: There were no oropharyngeal lesions. Neck was without thyromegaly. Lymphatics: Negative cervical, supraclavicular or axillary adenopathy. Respiratory: lungs were clear bilaterally without wheezing or crackles. Cardiovascular: Regular rate and rhythm, S1/S2, without murmur, rub or gallop. There was no pedal edema. GI: abdomen was soft, flat, nontender, nondistended, without organomegaly. Muscoloskeletal: no spinal tenderness of  palpation of vertebral spine. Skin exam was without echymosis, petichae. Neuro exam was nonfocal. Patient needed some help to get on and off exam table. Gait was weak.    LABORATORY/RADIOLOGY DATA:  Lab Results  Component Value Date   WBC 6.5 04/08/2012   HGB 11.4* 04/08/2012   HCT 34.3* 04/08/2012   PLT 174 04/08/2012   GLUCOSE 136* 04/08/2012   ALKPHOS 96 04/08/2012   ALT 22 04/08/2012   AST 29 04/08/2012   NA 129* 04/08/2012   K 3.5 04/08/2012   CL  93* 04/08/2012   CREATININE 1.2* 04/08/2012   BUN 15.0 04/08/2012   CO2 27 04/08/2012    ASSESSMENT AND PLAN:  1. Dementia: slightly worsened on chemo.  Now stable after 4 months.  2. History of renal insufficiency: Possibly due to myeloma. Now stable Cr.  3. Deconditioning: Increased weakness with increase in Revlimid to 10 mg. Will reduce this back to 5 mg. 4. Lumbar decompression fracture: no evidence of bone lytic lesion on bone survey. Compression fracture could be due to myeloma. She declined bone pain or cord compression symptoms. No therapy for this is indicated.  5. Anemia: Grade 1: From Revlimid. There is no active bleeding. There is no indication for transfusion.  6. Multiple myeloma: IgG lambda  S/p 4 months with mild decrease in Mspike from 2.6 down to 2.0.  She has grade 1-2 fatigue. anorexia which have now improved.  I recommended decreasing Revlimid dose back to 5mg  PO daily d1-21.  In the future, if she does well, I may consider adding on low dose Dexamethasone 10mg  PO once weekly to improve response. SPEP is pending today. 7. Anorexia: Either from myeloma or from treatment. She is on Remeron 7.5mg  PO daily.  This is working for her.  8. Code status: DNR/DNI per our discussion on 12/04/2011.  9.  Follow up:  In about 4 weeks.      The length of time of the face-to-face encounter was 25 minutes. More than 50% of time was spent counseling and coordination of care.

## 2012-04-12 LAB — PROTEIN ELECTROPHORESIS, SERUM
Albumin ELP: 35.6 % — ABNORMAL LOW (ref 55.8–66.1)
Alpha-1-Globulin: 3.8 % (ref 2.9–4.9)
Alpha-2-Globulin: 8.5 % (ref 7.1–11.8)
Beta 2: 30.3 % — ABNORMAL HIGH (ref 3.2–6.5)
Gamma Globulin: 17.5 % (ref 11.1–18.8)

## 2012-04-12 LAB — KAPPA/LAMBDA LIGHT CHAINS
Kappa free light chain: 12.2 mg/dL — ABNORMAL HIGH (ref 0.33–1.94)
Kappa:Lambda Ratio: 0.12 — ABNORMAL LOW (ref 0.26–1.65)

## 2012-04-22 ENCOUNTER — Ambulatory Visit (HOSPITAL_COMMUNITY)
Admission: RE | Admit: 2012-04-22 | Discharge: 2012-04-22 | Disposition: A | Discharge status: Home / Self Care | Payer: Medicare Other | Source: Ambulatory Visit | Attending: Family Medicine | Admitting: Family Medicine

## 2012-04-22 ENCOUNTER — Telehealth: Payer: Self-pay | Admitting: *Deleted

## 2012-04-22 ENCOUNTER — Other Ambulatory Visit (HOSPITAL_COMMUNITY): Payer: Self-pay | Admitting: Family Medicine

## 2012-04-22 DIAGNOSIS — R1031 Right lower quadrant pain: Secondary | ICD-10-CM

## 2012-04-22 DIAGNOSIS — I7090 Unspecified atherosclerosis: Secondary | ICD-10-CM | POA: Insufficient documentation

## 2012-04-22 DIAGNOSIS — K5730 Diverticulosis of large intestine without perforation or abscess without bleeding: Secondary | ICD-10-CM | POA: Insufficient documentation

## 2012-04-22 LAB — CBC WITH DIFFERENTIAL/PLATELET
Basophils Absolute: 0.1 10*3/uL (ref 0.0–0.1)
Basophils Relative: 1 % (ref 0–1)
Eosinophils Absolute: 0.3 10*3/uL (ref 0.0–0.7)
HCT: 33 % — ABNORMAL LOW (ref 36.0–46.0)
Hemoglobin: 11.1 g/dL — ABNORMAL LOW (ref 12.0–15.0)
MCH: 32.5 pg (ref 26.0–34.0)
MCHC: 33.6 g/dL (ref 30.0–36.0)
Monocytes Absolute: 0.3 10*3/uL (ref 0.1–1.0)
Monocytes Relative: 5 % (ref 3–12)
RDW: 17.1 % — ABNORMAL HIGH (ref 11.5–15.5)

## 2012-04-22 MED ORDER — IOHEXOL 300 MG/ML  SOLN
80.0000 mL | Freq: Once | INTRAMUSCULAR | Status: AC | PRN
  Administered 2012-04-22: 80 mL via INTRAVENOUS

## 2012-04-22 NOTE — Telephone Encounter (Signed)
Patient's daughter in law in [place], [place] reporting Jackqueline is going to see dr. Shelah Lewandowsky today.  She is having pain to her right side since last Friday night.  She has multiple myeloma so they want to make sure provider is aware of this pain.  Informed her Dr. Gaylyn Rong will return to office on 04-27-2012 butt this information will be left.

## 2012-05-05 ENCOUNTER — Telehealth: Payer: Self-pay | Admitting: *Deleted

## 2012-05-05 NOTE — Telephone Encounter (Signed)
[facility] faxed prescription refill request for revlimid 5 mg capsules.  Request to provider for review.

## 2012-05-06 ENCOUNTER — Ambulatory Visit (HOSPITAL_BASED_OUTPATIENT_CLINIC_OR_DEPARTMENT_OTHER): Payer: Medicare Other | Admitting: Lab

## 2012-05-06 ENCOUNTER — Ambulatory Visit (HOSPITAL_BASED_OUTPATIENT_CLINIC_OR_DEPARTMENT_OTHER): Payer: Medicare Other | Admitting: Oncology

## 2012-05-06 VITALS — BP 114/73 | HR 77 | Temp 96.4°F | Resp 20 | Ht 64.0 in | Wt 114.2 lb

## 2012-05-06 DIAGNOSIS — K5790 Diverticulosis of intestine, part unspecified, without perforation or abscess without bleeding: Secondary | ICD-10-CM

## 2012-05-06 DIAGNOSIS — E43 Unspecified severe protein-calorie malnutrition: Secondary | ICD-10-CM

## 2012-05-06 DIAGNOSIS — C9000 Multiple myeloma not having achieved remission: Secondary | ICD-10-CM

## 2012-05-06 DIAGNOSIS — K639 Disease of intestine, unspecified: Secondary | ICD-10-CM

## 2012-05-06 DIAGNOSIS — D649 Anemia, unspecified: Secondary | ICD-10-CM

## 2012-05-06 DIAGNOSIS — R5381 Other malaise: Secondary | ICD-10-CM

## 2012-05-06 DIAGNOSIS — M8448XA Pathological fracture, other site, initial encounter for fracture: Secondary | ICD-10-CM

## 2012-05-06 DIAGNOSIS — R630 Anorexia: Secondary | ICD-10-CM

## 2012-05-06 LAB — CBC WITH DIFFERENTIAL/PLATELET
BASO%: 0.8 % (ref 0.0–2.0)
Basophils Absolute: 0 10*3/uL (ref 0.0–0.1)
EOS%: 11.1 % — ABNORMAL HIGH (ref 0.0–7.0)
HCT: 29.9 % — ABNORMAL LOW (ref 34.8–46.6)
HGB: 10.5 g/dL — ABNORMAL LOW (ref 11.6–15.9)
LYMPH%: 52.2 % — ABNORMAL HIGH (ref 14.0–49.7)
MCH: 35.3 pg — ABNORMAL HIGH (ref 25.1–34.0)
MCV: 100.3 fL (ref 79.5–101.0)
MONO%: 11.2 % (ref 0.0–14.0)
NEUT%: 24.7 % — ABNORMAL LOW (ref 38.4–76.8)
Platelets: 177 10*3/uL (ref 145–400)
lymph#: 2.2 10*3/uL (ref 0.9–3.3)

## 2012-05-06 LAB — COMPREHENSIVE METABOLIC PANEL (CC13)
ALT: 10 U/L (ref 0–55)
Albumin: 2.6 g/dL — ABNORMAL LOW (ref 3.5–5.0)
Alkaline Phosphatase: 83 U/L (ref 40–150)
CO2: 27 mEq/L (ref 22–29)
Glucose: 119 mg/dl — ABNORMAL HIGH (ref 70–99)
Potassium: 3.9 mEq/L (ref 3.5–5.1)
Sodium: 128 mEq/L — ABNORMAL LOW (ref 136–145)
Total Protein: 7.9 g/dL (ref 6.4–8.3)

## 2012-05-06 NOTE — Progress Notes (Signed)
Called [facility] and made Hospice home health referral per Dr. Gaylyn Rong.  He will be the Attending and Hospice to assist w/ symptom management.   Gave son, Tony's cell # for primary contact.   Hospice contacted son Alinda Money and he made appointment for Assessment visit for Friday 05/13/12 per son's request.   Called [facility] to notify of Revlimid being discontinued.

## 2012-05-06 NOTE — Patient Instructions (Addendum)
1.  Diagnosis:  Multiple myeloma. 2.  Treatment:  Revlimid.  No response so far.  M-spike still elevated after 3 months of therapy. 3.  Options:  *  Increase dose of chemo:  Not a good option since she had severe fatigue.  *  Add on subcutaneous chemotherapy Velcade:  Once a week; in addition to steroid:  Higher chance of side effects (more fatigue; low blood count, infection, bleeding, skin rash).   *  Comfort care only (hospice when her condition worsens).

## 2012-05-09 NOTE — Treatment Plan (Signed)
[facility] END OF TREATMENT   Name: Jamie Bass Date: 05/09/2012 MRN: [id number] DOB: [birth year]   TREATMENT DATES: 11/2011 - 03/2012   REFERRING PHYSICIAN: Kaleen Mask, *   DIAGNOSIS: multiple myeloma.    STAGE AT START OF TREATMENT: 1.    INTENT:Palliative   DRUGS OR REGIMENS GIVEN:  Revlimid   MAJOR TOXICITIES: grade 2 fatigue, grade 2 anorexia/weight loss.     REASON TREATMENT STOPPED: lack of response.    PERFORMANCE STATUS AT END: ECOG 2-3   ONGOING PROBLEMS:  grade 2 fatigue, grade 2 anorexia/weight loss.     FOLLOW UP PLANS: home hospice placement.

## 2012-05-09 NOTE — Progress Notes (Signed)
[facility]  [phone] [phone]   OFFICE PROGRESS NOTE   Cc:  Kaleen Mask, MD  DIAGNOSIS: IgG lambda multiple myeloma.   CURRENT THERAPY: Started on Revlimid 10mg  PO daily d1-21 in June 2013.   INTERVAL HISTORY: Jamie Bass 78 y.o. female returns for regular follow up with her son and daughter-in-law.  She was very hard of hearing, and therefore, her family provided the history.  She presented to ED about 2 weeks ago with abdominal pain.  CT scan showed diverticulosis.  She then had bronchitis with cough, SOB, low grade fever.  Her PCP prescribed a course of oral antibiotic with resolution of these symptoms. She has decreased appetite.  She spends most of her awake time at rest.  She needs help with activities of daily living.    Patient's relative denied fever, headache, visual changes, confusion, drenching night sweats, palpable lymph node swelling, mucositis, odynophagia, dysphagia, nausea vomiting, jaundice, chest pain, palpitation, shortness of breath, dyspnea on exertion, productive cough, gum bleeding, epistaxis, hematemesis, hemoptysis, abdominal pain, abdominal swelling, melena, hematochezia, hematuria, skin rash, spontaneous bleeding, bowel bladder incontinence, focal motor weakness.    Past Medical History  Diagnosis Date  . Hiatal hernia   . Glaucoma(365)   . Macular degeneration   . GERD (gastroesophageal reflux disease)   . Leukocytosis   . Multiple myeloma 10/2011  . Fatigue   . Compression fracture of L1 lumbar vertebra 06/2011  . Dementia   . Hearing loss   . Depression 01/08/2012  . Anorexia 01/08/2012  . Depression 01/08/2012  . Anorexia 01/08/2012    Past Surgical History  Procedure Date  . No past surgeries     Current Outpatient Prescriptions  Medication Sig Dispense Refill  . COENZYME Q-10 PO Take by mouth daily.      . ergocalciferol (VITAMIN D2) 50000 UNITS capsule Take 50,000 Units by mouth once a  week. Every 2 weeks      . galantamine (RAZADYNE ER) 8 MG 24 hr capsule Take 8 mg by mouth. daily      . lenalidomide (REVLIMID) 5 MG capsule Take 1 capsule (5 mg total) by mouth daily.  21 capsule  0  . mirtazapine (REMERON) 7.5 MG tablet Take 1 tablet (7.5 mg total) by mouth at bedtime.  15 tablet  5  . multivitamin-lutein (OCUVITE-LUTEIN) CAPS Take 1 capsule by mouth daily.      Marland Kitchen OVER THE COUNTER MEDICATION Take 1 capsule by mouth daily. Bacopa 66.5mg  for alertness.      . polyethylene glycol (MIRALAX / GLYCOLAX) packet Take 17 g by mouth daily as needed.      . psyllium (METAMUCIL) 58.6 % powder Take 1 packet by mouth daily.        ALLERGIES:   has no known allergies.  REVIEW OF SYSTEMS:  The rest of the 14-point review of system was negative.   Filed Vitals:   05/06/12 1347  BP: 114/73  Pulse: 77  Temp: 96.4 F (35.8 C)  Resp: 20   Wt Readings from Last 3 Encounters:  05/06/12 114 lb 3.2 oz (51.801 kg)  04/08/12 116 lb 12.8 oz (52.98 kg)  03/04/12 115 lb 9.6 oz (52.436 kg)   ECOG Performance status: 2-3  PHYSICAL EXAMINATION:    General: Thin, tired-appearing woman, in no acute distress. Eyes: no scleral icterus. ENT: There were no oropharyngeal lesions. Neck was without thyromegaly. Lymphatics: Negative cervical, supraclavicular or axillary adenopathy. Respiratory: lungs were clear bilaterally without  wheezing or crackles. Cardiovascular: Regular rate and rhythm, S1/S2, without murmur, rub or gallop. There was no pedal edema. GI: abdomen was soft, flat, nontender, nondistended, without organomegaly. Muscoloskeletal: no spinal tenderness of palpation of vertebral spine. Skin exam was without echymosis, petichae. Neuro exam was nonfocal. She was not able to get off the wheelchair.    LABORATORY/RADIOLOGY DATA:  Lab Results  Component Value Date   WBC 4.3 05/06/2012   HGB 10.5* 05/06/2012   HCT 29.9* 05/06/2012   PLT 177 05/06/2012   GLUCOSE 119* 05/06/2012   ALKPHOS  83 05/06/2012   ALT 10 05/06/2012   AST 19 05/06/2012   NA 128* 05/06/2012   K 3.9 05/06/2012   CL 96* 05/06/2012   CREATININE 1.0 05/06/2012   BUN 12.0 05/06/2012   CO2 27 05/06/2012     ASSESSMENT AND PLAN:    1. Dementia.  2. History of renal insufficiency: Possibly due to myeloma. Now stable Cr.  3. Deconditioning: Continue to worsen.  4. Lumbar decompression fracture: no evidence of bone lytic lesion on bone survey. Compression fracture could be due to myeloma. She declined bone pain or cord compression symptoms. No therapy for this is indicated.  5. Anemia: Grade 1: From Revlimid. There is no active bleeding. There is no indication for transfusion.  6. Multiple myeloma: IgG lambda  - Status:  Revlimid chemo since 11/2011 with no response in her M-spike.  She has been having worsening performance status.  This is possibly due to Revlimid; but can also be due to her underlying co-morbidities.  - Options:  *  Increase dose of chemo:  Not a good option since she had severe fatigue.  *  Add on subcutaneous chemotherapy Velcade:  Once a week; in addition to steroid:  Higher chance of side effects (more fatigue; low blood count, infection, bleeding, skin rash).   *  Comfort care only (hospice when her condition worsens). - Family decided to stop all myeloma-directed therapy since they have concern that there would be more side effects than benefit.  I agreed with their decision.  They requested home hospice. This referral was made.   7. Anorexia: Either from myeloma or from treatment or from underlying co-morbidities. She is on Remeron 7.5mg  PO daily.   8.  Diverticulosis.  9.  Sigmoid thickening:  CT could not rule out colon cancer.  However, given her age, poor performance status, a sigmoidoscopy and colonoscopy would be very tough on her.  In addition, if she were to have colon cancer, she would not be in any shape for resection.  Family declined evaluation.  I agreed with this  decision.   10. Code status: DNR/DNI per our discussion on 12/04/2011.   11. Follow up: with home hospice.     The length of time of the face-to-face encounter was 40 minutes. More than 50% of time was spent counseling and coordination of care.

## 2012-05-10 LAB — PROTEIN ELECTROPHORESIS, SERUM
Alpha-1-Globulin: 3.8 % (ref 2.9–4.9)
Beta 2: 28.5 % — ABNORMAL HIGH (ref 3.2–6.5)
Beta Globulin: 4.5 % — ABNORMAL LOW (ref 4.7–7.2)
Gamma Globulin: 18.5 % (ref 11.1–18.8)

## 2012-05-10 LAB — KAPPA/LAMBDA LIGHT CHAINS
Kappa free light chain: 11.8 mg/dL — ABNORMAL HIGH (ref 0.33–1.94)
Lambda Free Lght Chn: 66.4 mg/dL — ABNORMAL HIGH (ref 0.57–2.63)

## 2012-05-18 ENCOUNTER — Telehealth: Payer: Self-pay | Admitting: *Deleted

## 2012-05-18 NOTE — Telephone Encounter (Signed)
Call from Hospice RN to clarify pt's hospice diagnosis.  States family says pt has colon cancer.  Called back and left Moquino a VM informing Multiple Myeloma is her diagnosis.  Pt did have a CT which showed thickening of her colon and colon cancer could not be ruled out.  It recommended a colonoscopy but pt too fragile for this procedure.  Asked her to call back if any further questions.

## 2012-08-05 ENCOUNTER — Ambulatory Visit: Payer: Medicare Other | Admitting: Oncology

## 2012-08-05 ENCOUNTER — Other Ambulatory Visit: Payer: Medicare Other | Admitting: Lab

## 2012-12-27 NOTE — Progress Notes (Signed)
Received call from Joylene Igo, RN @ [facility] stating that Dr. Barbee Shropshire, Hospice Physician, will be taking over patient's care after Dr. Emeterio Reeve practice.

## 2013-06-22 DEATH — deceased

## 2013-07-07 ENCOUNTER — Telehealth: Payer: Self-pay

## 2013-07-07 NOTE — Telephone Encounter (Signed)
Patient past away @ [facility] per Obituary in [place] [employer] °

## 2014-03-02 IMAGING — CT CT ABD-PELV W/ CM
2 of 5 series · 15 of 46 positions shown, 17 images · IV contrast (omnipaque)
Comparison: No priors.

CLINICAL DATA: Right lower quadrant abdominal pain.  Evaluate for
acute appendicitis.

CT ABDOMEN AND PELVIS WITH CONTRAST
TECHNIQUE: Multidetector CT imaging of the abdomen and pelvis was
performed following the standard protocol during bolus
administration of intravenous contrast.
Contrast: 80mL OMNIPAQUE IOHEXOL 300 MG/ML  SOLN

[Series 2: abd/pelv with 5.0 b31f st · axial · 0.72mm/px · z∈[-480,-60]mm · 12 of 94 slices shown, 14 images]
[im 5/94  soft-tissue]
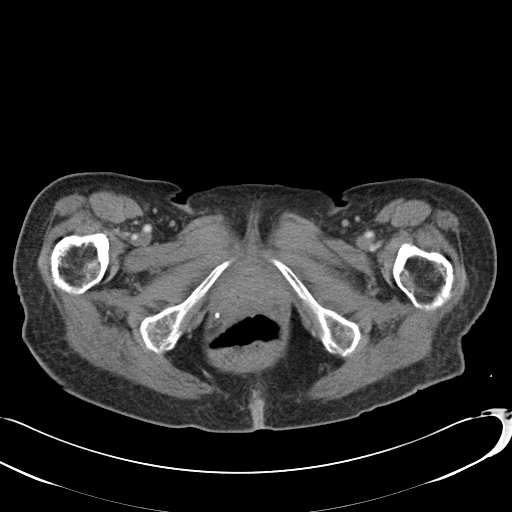
[im 5/94  bone]
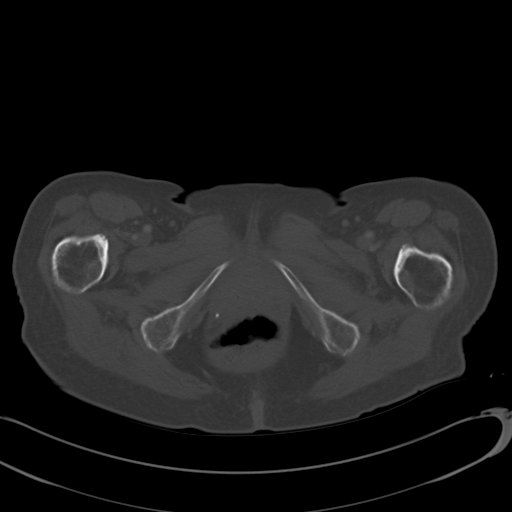
[im 14/94  soft-tissue]
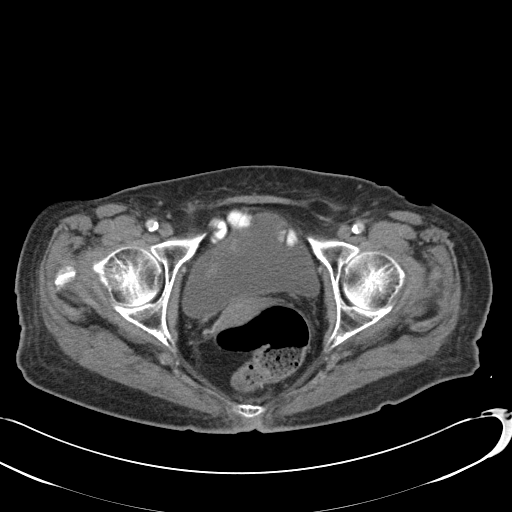
[im 19/94  soft-tissue]
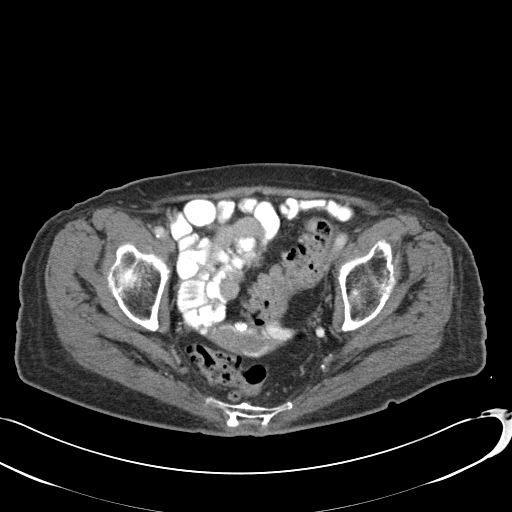
[im 28/94  soft-tissue]
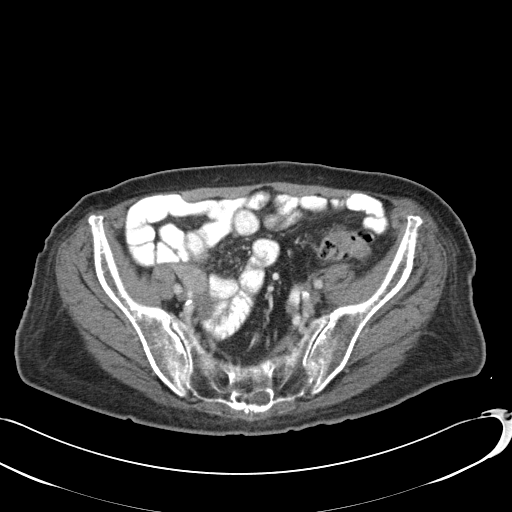
[im 38/94  soft-tissue]
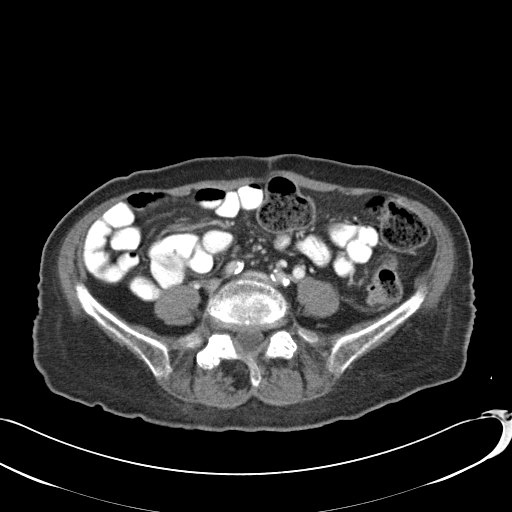
[im 42/94  soft-tissue]
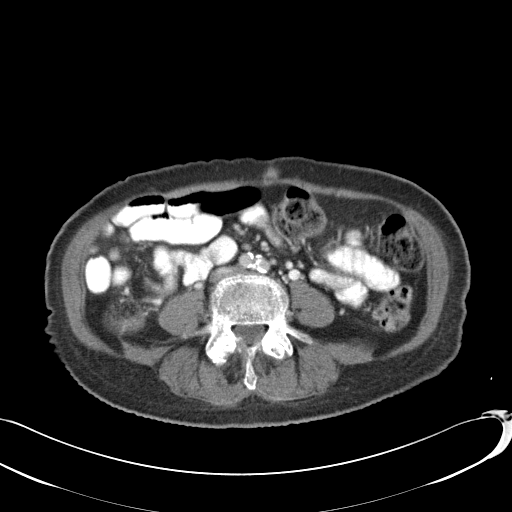
[im 52/94  soft-tissue]
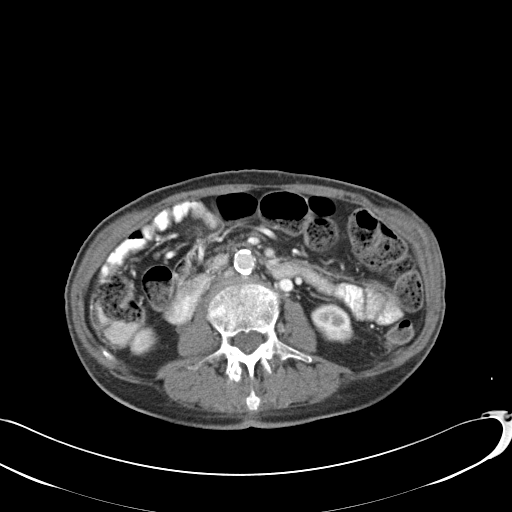
[im 56/94  soft-tissue]
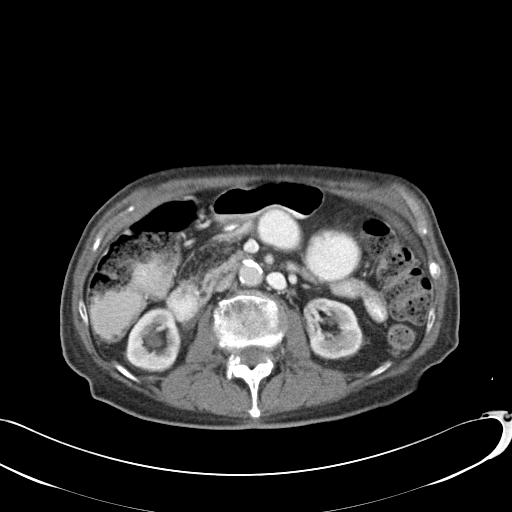
[im 66/94  soft-tissue]
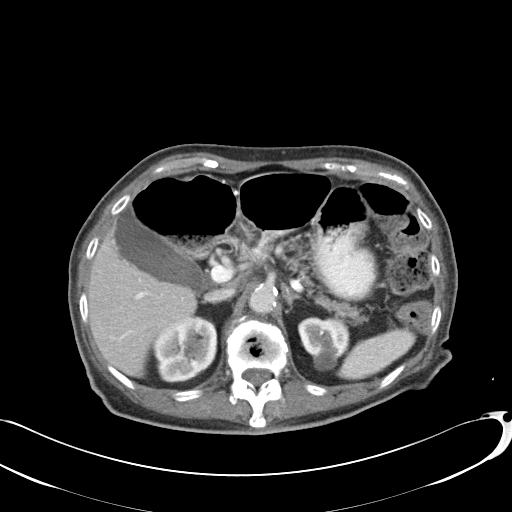
[im 66/94  bone]
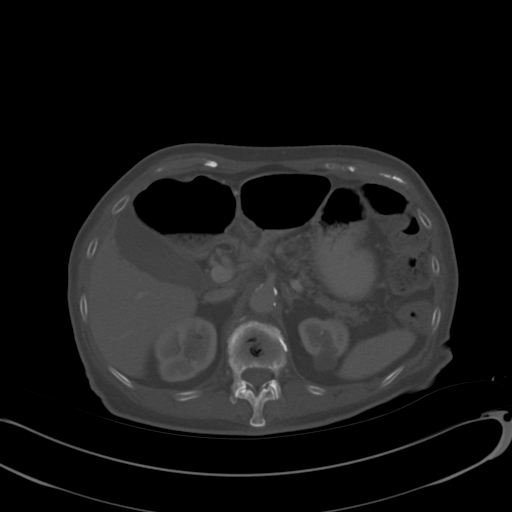
[im 75/94  soft-tissue]
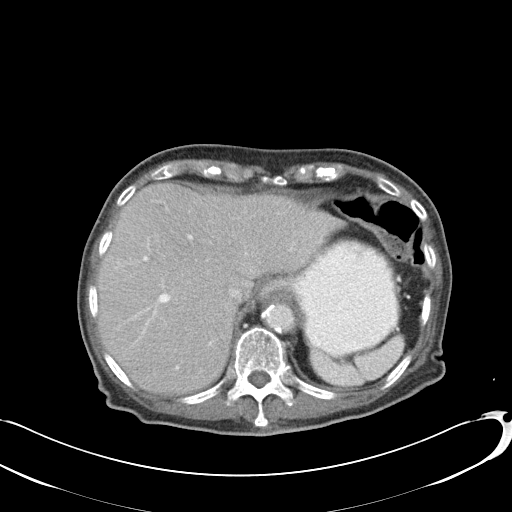
[im 80/94  soft-tissue]
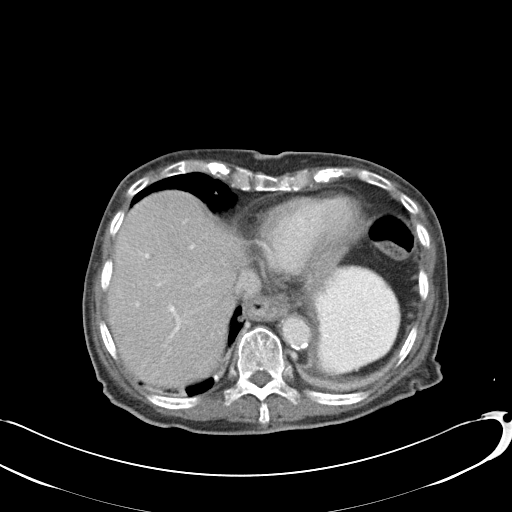
[im 89/94  soft-tissue]
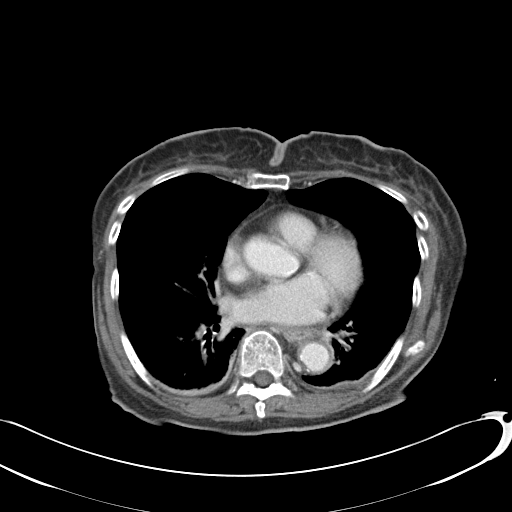

[Series 5: coronals · coronal · 0.91mm/px · 3 of 89 slices shown]
[im 30/89  soft-tissue]
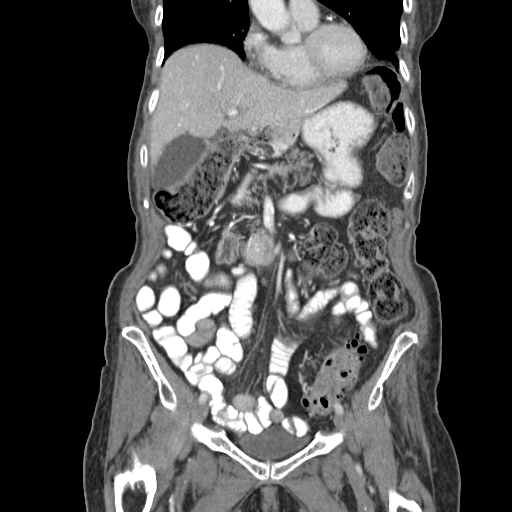
[im 40/89  soft-tissue]
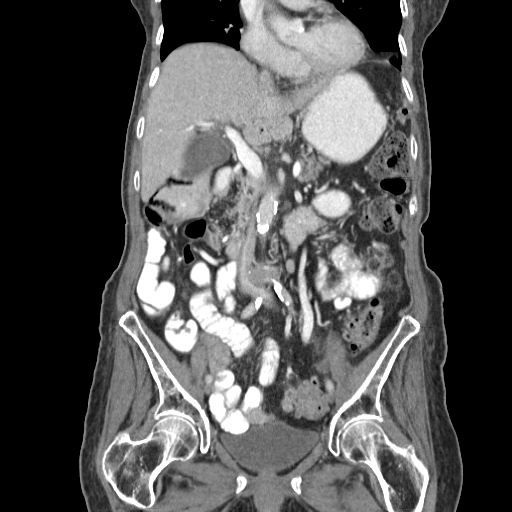
[im 49/89  soft-tissue]
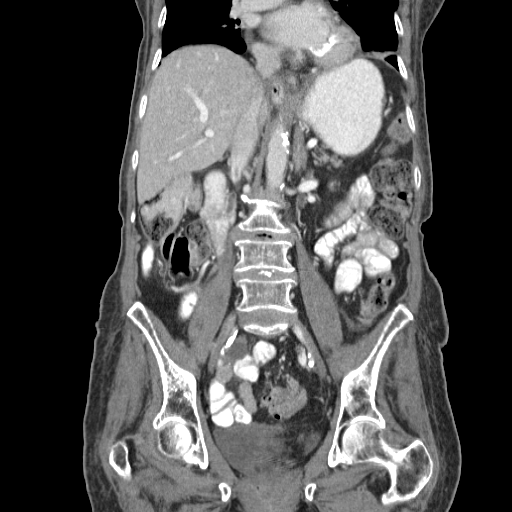

[15 of 46 positions shown; findings below may reference images not displayed]

FINDINGS: Lung Bases: Dependent atelectasis and/or scarring in the lower
lobes of the lungs bilaterally.  Calcified mediastinal lymph node.
Small hiatal hernia.  Atherosclerotic calcifications in the left
anterior descending and right coronary arteries.  Extensive mitral
annular calcifications.

Abdomen/Pelvis:  The enhanced appearance of the liver, gallbladder,
pancreas, spleen and bilateral adrenal glands is unremarkable.  A
1.7 cm low-attenuation lesion in the upper pole of the left kidney
is compatible with a cyst.  1.2 cm rim calcified lesion in the left
renal hilum is favored to represent a calcified aneurysm and the
renal artery branch.  Mild right hydronephrosis and slight
prominence of the proximal third of the right ureter, without
definite obstructing stone or other obstructing lesion.  No right
perinephric stranding.

The appendix is not confidently identified.  The cecum appears to
be hyper mobile, located in the upper central abdomen, and there
appears to be several unusually placed loops of small bowel in the
right side of the abdomen with an overall appearance that suggest
the presence of some internal hernia.  However, there is no
pathologic distension of small bowel to suggest bowel obstruction
at this time.  There is extensive colonic diverticulosis with
profound thickening of the sigmoid colon wall which is somewhat
mass-like in appearance.  Additionally, there is some subtle
stranding adjacent to the descending colon, which could suggest
mild or early acute diverticulitis.  No ascites.  No
pneumoperitoneum.  Uterus and ovaries are atrophic.  Urinary
bladder is unremarkable.  Extensive atherosclerosis in the
abdominal and pelvic vasculature, without definite aneurysm or
dissection.

Musculoskeletal: There are no aggressive appearing lytic or blastic
lesions noted in the visualized portions of the skeleton.
Compression fractures at T12 and L1 are again noted.  The T12
compression fracture appears unchanged compared to the prior plain
film examination with approximately 25% loss of anterior vertebral
body height.  The L1 compression fracture does appear slightly
worsened, now with near complete loss of anterior vertebral body
height and approximately 30% loss of posterior vertebral body
height.  Multilevel degenerative disc disease and lumbar
spondylosis is noted.
IMPRESSION: 1.  Extensive colonic extensive colonic diverticulosis with some
subtle inflammatory changes adjacent to the distal descending
colon, concerning for early or mild acute diverticulitis.
2.  In addition, there is extensive thickening of the sigmoid
colon.  This is in an area of severe diverticular disease.  It is
uncertain whether not this represents a benign chronic diverticular
scarring, or possible colonic neoplasm.  Correlation with
colonoscopy after the patient's acute illness subsides is highly
recommended to exclude underlying colonic neoplasm.
3.  Hypermobile cecum with an unusual relationship to the adjacent
loops of small bowel suggesting the presence of internal hernia.
However, this does not appear to be associated with small bowel
obstruction at this time.
4.  The appendix is not confidently identified.
5.  Extensive atherosclerosis.
6.  Mild right hydroureteronephrosis which abruptly terminates in
the proximal third of the right ureter.  No obstructing stone is
identified.  The possibility of a ureteral stricture is not
excluded.  Clinical correlation is recommended.
7.  Probable rim calcified aneurysm of the right renal artery
branch measuring 1.2 cm in diameter.
8. Worsening L1 compression fracture, as above.
9.  Additional incidental findings, as above.
